# Patient Record
Sex: Male | Born: 1960
Health system: Southern US, Community
[De-identification: ages and names within clinical notes are randomized; demographics above are authoritative.]

## PROBLEM LIST (undated history)

## (undated) DIAGNOSIS — E785 Hyperlipidemia, unspecified: Secondary | ICD-10-CM

## (undated) DIAGNOSIS — M542 Cervicalgia: Secondary | ICD-10-CM

## (undated) DIAGNOSIS — I10 Essential (primary) hypertension: Secondary | ICD-10-CM

## (undated) DIAGNOSIS — E119 Type 2 diabetes mellitus without complications: Secondary | ICD-10-CM

## (undated) DIAGNOSIS — R200 Anesthesia of skin: Secondary | ICD-10-CM

## (undated) HISTORY — DX: Cervicalgia: M54.2

## (undated) HISTORY — DX: Type 2 diabetes mellitus without complications: E11.9

## (undated) HISTORY — PX: HIP SURGERY: SHX245

## (undated) HISTORY — DX: Hyperlipidemia, unspecified: E78.5

## (undated) HISTORY — DX: Anesthesia of skin: R20.0

---

## 2014-11-22 ENCOUNTER — Other Ambulatory Visit: Payer: Self-pay | Admitting: Family Medicine

## 2014-11-22 ENCOUNTER — Encounter: Payer: Self-pay | Admitting: Family Medicine

## 2014-11-22 DIAGNOSIS — E1165 Type 2 diabetes mellitus with hyperglycemia: Secondary | ICD-10-CM

## 2014-11-22 DIAGNOSIS — H9193 Unspecified hearing loss, bilateral: Secondary | ICD-10-CM | POA: Insufficient documentation

## 2014-11-22 DIAGNOSIS — E118 Type 2 diabetes mellitus with unspecified complications: Secondary | ICD-10-CM | POA: Insufficient documentation

## 2014-11-22 DIAGNOSIS — E785 Hyperlipidemia, unspecified: Secondary | ICD-10-CM

## 2014-11-22 DIAGNOSIS — H409 Unspecified glaucoma: Secondary | ICD-10-CM | POA: Insufficient documentation

## 2014-11-22 DIAGNOSIS — I10 Essential (primary) hypertension: Secondary | ICD-10-CM | POA: Insufficient documentation

## 2014-11-22 DIAGNOSIS — E1169 Type 2 diabetes mellitus with other specified complication: Secondary | ICD-10-CM | POA: Insufficient documentation

## 2014-11-22 MED ORDER — ACCU-CHEK SMARTVIEW VI STRP: 1.0000 | ORAL_STRIP | Freq: Two times a day (BID) | Status: DC | PRN

## 2015-06-17 ENCOUNTER — Ambulatory Visit: Payer: Self-pay | Admitting: Family Medicine

## 2015-06-17 ENCOUNTER — Ambulatory Visit (INDEPENDENT_AMBULATORY_CARE_PROVIDER_SITE_OTHER): Payer: Medicare Other | Admitting: Family Medicine

## 2015-06-17 VITALS — BP 118/76 | HR 80 | Ht 63.0 in | Wt 158.0 lb

## 2015-06-17 DIAGNOSIS — Z23 Encounter for immunization: Secondary | ICD-10-CM

## 2015-06-17 DIAGNOSIS — M7581 Other shoulder lesions, right shoulder: Secondary | ICD-10-CM

## 2015-06-17 DIAGNOSIS — I10 Essential (primary) hypertension: Secondary | ICD-10-CM

## 2015-06-17 DIAGNOSIS — H409 Unspecified glaucoma: Secondary | ICD-10-CM

## 2015-06-17 DIAGNOSIS — E119 Type 2 diabetes mellitus without complications: Secondary | ICD-10-CM | POA: Diagnosis not present

## 2015-06-17 DIAGNOSIS — Z8639 Personal history of other endocrine, nutritional and metabolic disease: Secondary | ICD-10-CM | POA: Diagnosis not present

## 2015-06-17 MED ORDER — ASPIRIN 81 MG PO TABS: 81.0000 mg | ORAL_TABLET | Freq: Every day | ORAL | Status: DC

## 2015-06-17 MED ORDER — AMLODIPINE BESY-BENAZEPRIL HCL 5-20 MG PO CAPS: 1.0000 | ORAL_CAPSULE | Freq: Every day | ORAL | Status: DC

## 2015-06-17 MED ORDER — SITAGLIPTIN PHOSPHATE 100 MG PO TABS: 100.0000 mg | ORAL_TABLET | Freq: Every day | ORAL | Status: DC

## 2015-06-17 MED ORDER — METFORMIN HCL 1000 MG PO TABS: 1000.0000 mg | ORAL_TABLET | Freq: Two times a day (BID) | ORAL | Status: DC

## 2015-06-17 MED ORDER — PIOGLITAZONE HCL 30 MG PO TABS: 30.0000 mg | ORAL_TABLET | Freq: Every day | ORAL | Status: DC

## 2015-06-17 MED ORDER — LATANOPROST 0.005 % OP SOLN: 1.0000 [drp] | Freq: Every day | OPHTHALMIC | Status: DC

## 2015-06-18 ENCOUNTER — Encounter: Payer: Self-pay | Admitting: Family Medicine

## 2015-06-18 ENCOUNTER — Other Ambulatory Visit: Payer: Self-pay | Admitting: Family Medicine

## 2015-06-18 LAB — LIPID PANEL
Chol/HDL Ratio: 5.3 ratio units — ABNORMAL HIGH (ref 0.0–5.0)
Cholesterol, Total: 155 mg/dL (ref 100–199)
HDL: 29 mg/dL — AB (ref >39)
Triglycerides: 516 mg/dL — ABNORMAL HIGH (ref 0–149)

## 2015-06-18 LAB — HEMOGLOBIN A1C
ESTIMATED AVERAGE GLUCOSE: 212 mg/dL
Hgb A1c MFr Bld: 9 % — ABNORMAL HIGH (ref 4.8–5.6)

## 2015-06-18 MED ORDER — SIMVASTATIN 40 MG PO TABS: 40.0000 mg | ORAL_TABLET | Freq: Every day | ORAL | Status: DC

## 2015-06-18 MED ORDER — GLIPIZIDE 5 MG PO TABS: 5.0000 mg | ORAL_TABLET | Freq: Two times a day (BID) | ORAL | Status: DC

## 2015-06-18 NOTE — Progress Notes (Signed)
Date:  06/17/2015   Name:  Daniel Bell   DOB:  Nov 19, 1960   MRN:  [id number]  PCP:  No primary care provider on file.    Chief Complaint: Diabetes and Hypertension   History of Present Illness:  This is a 55 y.o. male with T2DM, HTN, glaucoma, hx HLD for first f/u in 8 months. C/o R shoulder pain with abduction past 90 degrees past several months, not improving. Had some sharp nonexertional CP last week no ass cardiac sxs and no further episodes. Has optho appt in April. Has started asa 81 mg daily. Needs flu and pneumo imm.  Review of Systems:  Review of Systems  Constitutional: Negative for fever.  Respiratory: Negative for shortness of breath.   Cardiovascular: Negative for leg swelling.  Endocrine: Negative for polyuria.  Genitourinary: Negative for difficulty urinating.  Neurological: Negative for syncope and light-headedness.    Patient Active Problem List   Diagnosis Date Noted  . Diabetes mellitus type 2, controlled, without complications ([place]) 36/05/2448  . Essential (primary) hypertension 11/22/2014  . Glaucoma 11/22/2014  . Bilateral hearing loss 11/22/2014  . H/O elevated lipids 11/22/2014    Prior to Admission medications   Medication Sig Start Date End Date Taking? Authorizing Provider  ACCU-CHEK FASTCLIX LANCETS MISC 2 (two) times daily. for testing 08/31/14  Yes Historical Provider, MD  ACCU-CHEK SMARTVIEW test strip 1 each by Other route 2 (two) times daily as needed. 11/22/14  Yes Adline Potter, MD  amLODipine-benazepril (LOTREL) 5-20 MG capsule Take 1 capsule by mouth daily. 06/17/15  Yes Adline Potter, MD  Blood Glucose Monitoring Suppl (ACCU-CHEK NANO SMARTVIEW) W/DEVICE KIT  08/30/14  Yes Historical Provider, MD  Coenzyme Q10 (MEGA COQ10) 400 MG CAPS Take 1 capsule by mouth daily.   Yes Historical Provider, MD  latanoprost (XALATAN) 0.005 % ophthalmic solution Place 1 drop into both eyes at bedtime. 06/17/15  Yes Adline Potter, MD  metFORMIN (GLUCOPHAGE) 1000 MG  tablet Take 1 tablet (1,000 mg total) by mouth 2 (two) times daily. 06/17/15  Yes Adline Potter, MD  pioglitazone (ACTOS) 30 MG tablet Take 1 tablet (30 mg total) by mouth daily. 06/17/15  Yes Adline Potter, MD  sitaGLIPtin (JANUVIA) 100 MG tablet Take 1 tablet (100 mg total) by mouth daily. 06/17/15  Yes Adline Potter, MD  aspirin 81 MG tablet Take 1 tablet (81 mg total) by mouth daily. 06/17/15   Adline Potter, MD    No Known Allergies  No past surgical history on file.  Social History  Substance Use Topics  . Smoking status: Not on file  . Smokeless tobacco: Not on file  . Alcohol Use: Not on file    No family history on file.  Medication list has been reviewed and updated.  Physical Examination: BP 118/76 mmHg  Pulse 80  Ht 5' 3"  (1.6 m)  Wt 158 lb (71.668 kg)  BMI 28.00 kg/m2  Physical Exam  Constitutional: He appears well-developed and well-nourished.  Cardiovascular: Normal rate, regular rhythm and normal heart sounds.   Pulmonary/Chest: Effort normal and breath sounds normal.  Musculoskeletal: He exhibits no edema.  Decreased RUE abduction  Neurological: He is alert.  Skin: Skin is warm and dry.  Psychiatric: He has a normal mood and affect. His behavior is normal.  Nursing note and vitals reviewed.   Assessment and Plan:  1. Controlled type 2 diabetes mellitus without complication, without long-term current use of insulin (HCC) Last a1c 7.0% 10/02/14 - HgB A1c - Lipid Profile  2. Essential (primary) hypertension Well controlled, cont Lotrel  3. Right rotator cuff tendinitis - Ambulatory referral to Physical Therapy  4. Glaucoma Optho f/u in April  5. H/O elevated lipids Check lipids, consider statin given 10 yr CV risk 11.6%  6. Need for influenza vaccination - Flu Vaccine QUAD 36+ mos PF IM (Fluarix & Fluzone Quad PF)  7. Need for pneumococcal vaccination - Pneumococcal polysaccharide vaccine 23-valent greater than or equal to 2yo  subcutaneous/IM  Return in about 3 months (around 09/15/2015).  Satira Anis. [facility]  06/18/2015

## 2015-07-08 ENCOUNTER — Other Ambulatory Visit: Payer: Self-pay | Admitting: Family Medicine

## 2015-07-08 MED ORDER — SIMVASTATIN 40 MG PO TABS: 40.0000 mg | ORAL_TABLET | Freq: Every day | ORAL | Status: DC

## 2015-08-08 ENCOUNTER — Encounter: Payer: Self-pay | Admitting: Family Medicine

## 2015-08-08 ENCOUNTER — Ambulatory Visit (INDEPENDENT_AMBULATORY_CARE_PROVIDER_SITE_OTHER): Payer: Medicare Other | Admitting: Family Medicine

## 2015-08-08 VITALS — BP 118/68 | HR 88 | Temp 98.2°F | Resp 16 | Ht 63.0 in | Wt 157.0 lb

## 2015-08-08 DIAGNOSIS — E785 Hyperlipidemia, unspecified: Secondary | ICD-10-CM

## 2015-08-08 DIAGNOSIS — E1165 Type 2 diabetes mellitus with hyperglycemia: Secondary | ICD-10-CM | POA: Diagnosis not present

## 2015-08-08 DIAGNOSIS — Z23 Encounter for immunization: Secondary | ICD-10-CM | POA: Diagnosis not present

## 2015-08-08 DIAGNOSIS — H409 Unspecified glaucoma: Secondary | ICD-10-CM | POA: Diagnosis not present

## 2015-08-08 DIAGNOSIS — I10 Essential (primary) hypertension: Secondary | ICD-10-CM

## 2015-08-08 DIAGNOSIS — IMO0001 Reserved for inherently not codable concepts without codable children: Secondary | ICD-10-CM

## 2015-08-08 DIAGNOSIS — H9193 Unspecified hearing loss, bilateral: Secondary | ICD-10-CM

## 2015-08-08 NOTE — Progress Notes (Signed)
Date:  08/08/2015   Name:  Daniel Bell   DOB:  [birth year]   MRN:  [id number]  PCP:  No primary care provider on file.    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 Placed on glipizide and simvastatin last visit for marginally controlled DM and HLD. No new concerns, tolerating both meds well. BG's well controlled at home. 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   Review of Systems:  Review of Systems  Constitutional: Negative for fever and fatigue.  Respiratory: Negative for shortness of breath.   Cardiovascular: Negative for chest pain and leg swelling.  Endocrine: Negative for polyuria.  Genitourinary: Negative for difficulty urinating.  Neurological: Negative for syncope and light-headedness.    Patient Active Problem List   Diagnosis Date Noted  . Diabetes mellitus type 2, uncontrolled, without complications ([place]) 65/99/3570  . Essential (primary) hypertension 11/22/2014  . Glaucoma 11/22/2014  . Bilateral hearing loss 11/22/2014  . Hyperlipidemia 11/22/2014    Prior to Admission medications   Medication Sig Start Date End Date Taking? Authorizing Provider  amLODipine-benazepril (LOTREL) 5-20 MG capsule Take 1 capsule by mouth daily. 06/17/15  Yes Adline Potter, MD  aspirin 81 MG tablet Take 1 tablet (81 mg total) by mouth daily. 06/17/15  Yes Adline Potter, MD  Blood Glucose Monitoring Suppl (ACCU-CHEK NANO SMARTVIEW) W/DEVICE KIT  08/30/14  Yes Historical Provider, MD  Coenzyme Q10 (MEGA COQ10) 400 MG CAPS Take 1 capsule by mouth daily.   Yes Historical Provider, MD  glipiZIDE (GLUCOTROL) 5 MG tablet Take 1 tablet (5 mg total) by mouth 2 (two) times daily before a meal. 06/18/15  Yes Adline Potter, MD  latanoprost (XALATAN) 0.005 % ophthalmic solution Place 1 drop into both eyes at bedtime. 06/17/15  Yes Adline Potter, MD  metFORMIN (GLUCOPHAGE) 1000 MG tablet Take 1 tablet (1,000 mg total) by mouth  2 (two) times daily. 06/17/15  Yes Adline Potter, MD  pioglitazone (ACTOS) 30 MG tablet Take 1 tablet (30 mg total) by mouth daily. 06/17/15  Yes Adline Potter, MD  simvastatin (ZOCOR) 40 MG tablet Take 1 tablet (40 mg total) by mouth daily. 07/08/15  Yes Adline Potter, MD  sitaGLIPtin (JANUVIA) 100 MG tablet Take 1 tablet (100 mg total) by mouth daily. 06/17/15  Yes Adline Potter, MD  ACCU-CHEK FASTCLIX LANCETS MISC 2 (two) times daily. for testing 08/31/14   Historical Provider, MD  ACCU-CHEK SMARTVIEW test strip 1 each by Other route 2 (two) times daily as needed. 11/22/14   Adline Potter, MD    No Known Allergies  History reviewed. No pertinent past surgical history.  Social History  Substance Use Topics  . Smoking status: Never Smoker   . Smokeless tobacco: None  . Alcohol Use: No    Family History  Problem Relation Age of Onset  . Family history unknown: Yes    Medication list has been reviewed and updated.  Physical Examination: BP 118/68 mmHg  Pulse 88  Temp(Src) 98.2 F (36.8 C)  Resp 16  Ht 5' 3"  (1.6 m)  Wt 157 lb (71.215 kg)  BMI 27.82 kg/m2  SpO2 100%  Physical Exam  Constitutional: He is oriented to person, place, and time. He appears well-developed and well-nourished.  HENT:  Hearing aids in place  Eyes: Conjunctivae and EOM are normal. Pupils are equal, round, and reactive to light.  Neck: Neck supple. No thyromegaly present.  Cardiovascular:  Normal rate, regular rhythm and normal heart sounds.   Pulmonary/Chest: Effort normal and breath sounds normal.  Abdominal: Soft. He exhibits no distension and no mass. There is no tenderness.  Musculoskeletal: He exhibits no edema.  Lymphadenopathy:    He has no cervical adenopathy.  Neurological: He is alert and oriented to person, place, and time. Coordination normal.  Skin: Skin is warm and dry.  Psychiatric: He has a normal mood and affect. His behavior is normal.  Nursing note and vitals reviewed.   Assessment  and Plan:  1. Essential (primary) hypertension Well controlled, cont Lotrel  2. Bilateral hearing loss Hearing aids in place, hearing grossly intact  3. Uncontrolled type 2 diabetes mellitus without complication, without long-term current use of insulin ([place]) A1c 9.0% in Jan., requests defer repeat testing until scheduled visit next month  4. Hyperlipidemia On Zocor, recheck lipids next visit  5. Gl[facility]heck [facility] when returns for [facility]  6. 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  Satira Anis. [facility]  08/08/2015

## 2015-08-12 ENCOUNTER — Ambulatory Visit (INDEPENDENT_AMBULATORY_CARE_PROVIDER_SITE_OTHER): Payer: Medicare Other

## 2015-08-12 DIAGNOSIS — Z111 Encounter for screening for respiratory tuberculosis: Secondary | ICD-10-CM

## 2015-09-16 ENCOUNTER — Ambulatory Visit: Payer: Medicare Other | Admitting: Family Medicine

## 2015-10-16 ENCOUNTER — Encounter: Payer: Self-pay | Admitting: Family Medicine

## 2015-10-16 ENCOUNTER — Ambulatory Visit (INDEPENDENT_AMBULATORY_CARE_PROVIDER_SITE_OTHER): Payer: Medicare Other | Admitting: Family Medicine

## 2015-10-16 VITALS — BP 122/82 | HR 95 | Resp 16 | Ht 63.0 in | Wt 149.6 lb

## 2015-10-16 DIAGNOSIS — I10 Essential (primary) hypertension: Secondary | ICD-10-CM | POA: Diagnosis not present

## 2015-10-16 DIAGNOSIS — E785 Hyperlipidemia, unspecified: Secondary | ICD-10-CM

## 2015-10-16 DIAGNOSIS — H409 Unspecified glaucoma: Secondary | ICD-10-CM | POA: Diagnosis not present

## 2015-10-16 DIAGNOSIS — E1165 Type 2 diabetes mellitus with hyperglycemia: Secondary | ICD-10-CM

## 2015-10-16 DIAGNOSIS — IMO0001 Reserved for inherently not codable concepts without codable children: Secondary | ICD-10-CM

## 2015-10-16 MED ORDER — METFORMIN HCL 1000 MG PO TABS: 1000.0000 mg | ORAL_TABLET | Freq: Two times a day (BID) | ORAL | Status: DC

## 2015-10-16 MED ORDER — PIOGLITAZONE HCL 30 MG PO TABS: 30.0000 mg | ORAL_TABLET | Freq: Every day | ORAL | Status: DC

## 2015-10-16 MED ORDER — AMLODIPINE BESY-BENAZEPRIL HCL 5-20 MG PO CAPS: 1.0000 | ORAL_CAPSULE | Freq: Every day | ORAL | Status: DC

## 2015-10-16 MED ORDER — GLIPIZIDE 5 MG PO TABS: 5.0000 mg | ORAL_TABLET | Freq: Two times a day (BID) | ORAL | Status: DC

## 2015-10-16 MED ORDER — SITAGLIPTIN PHOSPHATE 100 MG PO TABS: 100.0000 mg | ORAL_TABLET | Freq: Every day | ORAL | Status: DC

## 2015-10-16 MED ORDER — SIMVASTATIN 40 MG PO TABS: 40.0000 mg | ORAL_TABLET | Freq: Every day | ORAL | Status: DC

## 2015-10-16 NOTE — Progress Notes (Signed)
Date:  10/16/2015   Name:  Daniel Bell   DOB:  01-03-1961   MRN:  [id number]  PCP:  No primary care provider on file.    Chief Complaint: Hypertension   History of Present Illness:  This is a 55 y.o. male seen for two month f/u. No new concerns. Needs refills all meds. Glipizide and Zocor started in January. Seeing optho q 73mfor glaucoma.   Review of Systems:  Review of Systems  Constitutional: Negative for fever and fatigue.  Respiratory: Negative for cough and shortness of breath.   Cardiovascular: Negative for chest pain and leg swelling.  Endocrine: Negative for polyuria.  Genitourinary: Negative for difficulty urinating.  Neurological: Negative for syncope and light-headedness.    Patient Active Problem List   Diagnosis Date Noted  . Diabetes mellitus type 2, uncontrolled, [place] complications [place] 026/33/3545 . Essential (primary) hypertension 11/22/2014  . Glaucoma 11/22/2014  . Bilateral hearing loss 11/22/2014  . Hyperlipidemia 11/22/2014    Prior to Admission medications   Medication Sig Start Date End Date Taking? Authorizing Provider  ACCU-CHEK FASTCLIX LANCETS MISC 2 (two) times daily. for testing 08/31/14  Yes Historical Provider, MD  ACCU-CHEK SMARTVIEW test strip 1 each by Other route 2 (two) times daily as needed. 11/22/14  Yes WAdline Potter MD  amLODipine-benazepril (LOTREL) 5-20 MG capsule Take 1 capsule by mouth daily. 10/16/15  Yes WAdline Potter MD  aspirin 81 MG tablet Take 1 tablet (81 mg total) by mouth daily. 06/17/15  Yes WAdline Potter MD  Blood Glucose Monitoring Suppl (ACCU-CHEK NANO SMARTVIEW) W/DEVICE KIT  08/30/14  Yes Historical Provider, MD  Coenzyme Q10 (MEGA COQ10) 400 MG CAPS Take 1 capsule by mouth daily.   Yes Historical Provider, MD  glipiZIDE (GLUCOTROL) 5 MG tablet Take 1 tablet (5 mg total) by mouth 2 (two) times daily before a meal. 10/16/15  Yes WAdline Potter MD  latanoprost (XALATAN) 0.005 % ophthalmic solution Place 1 drop into both  eyes at bedtime. 06/17/15  Yes WAdline Potter MD  metFORMIN (GLUCOPHAGE) 1000 MG tablet Take 1 tablet (1,000 mg total) by mouth 2 (two) times daily. 10/16/15  Yes WAdline Potter MD  pioglitazone (ACTOS) 30 MG tablet Take 1 tablet (30 mg total) by mouth daily. 10/16/15  Yes WAdline Potter MD  simvastatin (ZOCOR) 40 MG tablet Take 1 tablet (40 mg total) by mouth daily. 10/16/15  Yes WAdline Potter MD  sitaGLIPtin (JANUVIA) 100 MG tablet Take 1 tablet (100 mg total) by mouth daily. 10/16/15  Yes WAdline Potter MD    No Known Allergies  History reviewed. No pertinent past surgical history.  Social History  Substance Use Topics  . Smoking status: Never Smoker   . Smokeless tobacco: None  . Alcohol Use: No    Family History  Problem Relation Age of Onset  . Family history unknown: Yes    Medication list has been reviewed and updated.  Physical Examination: BP 122/82 mmHg  Pulse 95  Resp 16  Ht _0  (1.6 m)  Wt 149 lb 9.6 oz (67.858 kg)  BMI 26.51 kg/m2  SpO2 99%  Physical Exam  Constitutional: He appears well-developed and well-nourished.  Cardiovascular: Normal rate, regular rhythm and normal heart sounds.   Pulmonary/Chest: Effort normal and breath sounds normal.  Musculoskeletal: He exhibits no edema.  Neurological: He is alert.  Skin: Skin is warm and dry.  Psychiatric: He has a normal mood and affect. His behavior is normal.  Nursing note and vitals reviewed.  Assessment and Plan:  1. 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 ratio  2. Essential (primary) hypertension Well controlled on Lotrel - Hepatitis C Antibody - Comprehensive Metabolic Panel (CMET)  3. Glaucoma Followed by optho  4. Hyperlipidemia Unclear control on Zocor x 4 months - Lipid  Profile  Return in about 3 months (around 01/16/2016).  Satira Anis. [facility]  10/16/2015

## 2015-10-17 LAB — LIPID PANEL
CHOL/HDL RATIO: 2.7 ratio (ref 0.0–5.0)
Cholesterol, Total: 101 mg/dL (ref 100–199)
HDL: 37 mg/dL — AB (ref >39)
LDL CALC: 41 mg/dL (ref 0–99)
Triglycerides: 115 mg/dL (ref 0–149)
VLDL CHOLESTEROL CAL: 23 mg/dL (ref 5–40)

## 2015-10-17 LAB — COMPREHENSIVE METABOLIC PANEL
ALBUMIN: 4.8 g/dL (ref 3.5–5.5)
ALT: 14 IU/L (ref 0–44)
AST: 13 IU/L (ref 0–40)
Albumin/Globulin Ratio: 1.8 (ref 1.2–2.2)
Alkaline Phosphatase: 125 IU/L — ABNORMAL HIGH (ref 39–117)
BUN / CREAT RATIO: 21 — AB (ref 9–20)
BUN: 20 mg/dL (ref 6–24)
Bilirubin Total: 0.6 mg/dL (ref 0.0–1.2)
CALCIUM: 9.8 mg/dL (ref 8.7–10.2)
CO2: 19 mmol/L (ref 18–29)
CREATININE: 0.96 mg/dL (ref 0.76–1.27)
Chloride: 98 mmol/L (ref 96–106)
GFR calc non Af Amer: 89 mL/min/{1.73_m2} (ref >59)
GFR, EST AFRICAN AMERICAN: 102 mL/min/{1.73_m2} (ref >59)
Globulin, Total: 2.7 g/dL (ref 1.5–4.5)
Glucose: 163 mg/dL — ABNORMAL HIGH (ref 65–99)
Potassium: 4.5 mmol/L (ref 3.5–5.2)
Sodium: 136 mmol/L (ref 134–144)
TOTAL PROTEIN: 7.5 g/dL (ref 6.0–8.5)

## 2015-10-17 LAB — HEMOGLOBIN A1C
ESTIMATED AVERAGE GLUCOSE: 203 mg/dL
HEMOGLOBIN A1C: 8.7 % — AB (ref 4.8–5.6)

## 2015-10-17 LAB — MICROALBUMIN / CREATININE URINE RATIO
Creatinine, Urine: 113.1 mg/dL
MICROALB/CREAT RATIO: 8.3 mg/g creat (ref 0.0–30.0)
Microalbumin, Urine: 9.4 ug/mL

## 2015-10-17 LAB — HEPATITIS C ANTIBODY: HEP C VIRUS AB: 0.1 {s_co_ratio} (ref 0.0–0.9)

## 2015-11-20 ENCOUNTER — Encounter: Payer: Self-pay | Admitting: Emergency Medicine

## 2015-11-20 ENCOUNTER — Ambulatory Visit
Admission: EM | Admit: 2015-11-20 | Discharge: 2015-11-20 | Disposition: A | Discharge status: Home / Self Care | Payer: Medicare Other | Attending: Family Medicine | Admitting: Family Medicine

## 2015-11-20 ENCOUNTER — Ambulatory Visit (INDEPENDENT_AMBULATORY_CARE_PROVIDER_SITE_OTHER): Payer: Medicare Other

## 2015-11-20 DIAGNOSIS — M5412 Radiculopathy, cervical region: Secondary | ICD-10-CM

## 2015-11-20 DIAGNOSIS — M50321 Other cervical disc degeneration at C4-C5 level: Secondary | ICD-10-CM | POA: Diagnosis not present

## 2015-11-20 HISTORY — DX: Essential (primary) hypertension: I10

## 2015-11-20 MED ORDER — CYCLOBENZAPRINE HCL 5 MG PO TABS: ORAL_TABLET | ORAL | Status: DC

## 2015-11-20 MED ORDER — NAPROXEN 500 MG PO TABS: 500.0000 mg | ORAL_TABLET | Freq: Two times a day (BID) | ORAL | Status: DC

## 2015-11-20 NOTE — ED Provider Notes (Signed)
CSN: [id number]     Arrival date & time 11/20/15  1223 History   First MD Initiated Contact with Patient 11/20/15 1246     Chief Complaint  Patient presents with  . Extremity Pain   (Consider location/radiation/quality/duration/timing/severity/associated sxs/prior Treatment) HPI: Patient presents today with symptoms of right arm pain and at times numbness. Patient states that the symptoms started around his right scapular area and come down his arm into his forearm and to his right pinky. Patient states that he has never had symptoms like this before. He denies any trauma or injury to his shoulder or neck or elbow recently. He did have an injury to his right elbow/forearm area when he was a child but did not see a doctor for it. He has limited extension and supination because of this injury as a child. He has been trying to sleep on a few pillows since his symptoms started and has slept in a recliner. He denies any right leg symptoms or headache. He denies any chest pain or shortness of breath. He has had the symptoms for about a week now.  Past Medical History  Diagnosis Date  . Diabetes mellitus without complication ([place])   . Hyperlipidemia   . Hypertension    History reviewed. No pertinent past surgical history. Family History  Problem Relation Age of Onset  . Diabetes Mother    Social History  Substance Use Topics  . Smoking status: Current Some Day Smoker  . Smokeless tobacco: None  . Alcohol Use: No    Review of Systems: Negative except mentioned above.   Allergies  Review of patient's allergies indicates no known allergies.  Home Medications   Prior to Admission medications   Medication Sig Start Date End Date Taking? Authorizing Provider  ACCU-CHEK FASTCLIX LANCETS MISC 2 (two) times daily. for testing 08/31/14   Historical Provider, MD  ACCU-CHEK SMARTVIEW test strip 1 each by Other route 2 (two) times daily as needed. 11/22/14   Adline Potter, MD  amLODipine-benazepril  (LOTREL) 5-20 MG capsule Take 1 capsule by mouth daily. 10/16/15   Adline Potter, MD  aspirin 81 MG tablet Take 1 tablet (81 mg total) by mouth daily. 06/17/15   Adline Potter, MD  Blood Glucose Monitoring Suppl (ACCU-CHEK NANO SMARTVIEW) W/DEVICE KIT  08/30/14   Historical Provider, MD  Coenzyme Q10 (MEGA COQ10) 400 MG CAPS Take 1 capsule by mouth daily.    Historical Provider, MD  glipiZIDE (GLUCOTROL) 5 MG tablet Take 1 tablet (5 mg total) by mouth 2 (two) times daily before a meal. 10/16/15   Adline Potter, MD  latanoprost (XALATAN) 0.005 % ophthalmic solution Place 1 drop into both eyes at bedtime. 06/17/15   Adline Potter, MD  metFORMIN (GLUCOPHAGE) 1000 MG tablet Take 1 tablet (1,000 mg total) by mouth 2 (two) times daily. 10/16/15   Adline Potter, MD  pioglitazone (ACTOS) 30 MG tablet Take 1 tablet (30 mg total) by mouth daily. 10/16/15   Adline Potter, MD  simvastatin (ZOCOR) 40 MG tablet Take 1 tablet (40 mg total) by mouth daily. 10/16/15   Adline Potter, MD  sitaGLIPtin (JANUVIA) 100 MG tablet Take 1 tablet (100 mg total) by mouth daily. 10/16/15   Adline Potter, MD   Meds Ordered and Administered this Visit  Medications - No data to display  BP 122/64 mmHg  Pulse 89  Temp(Src) 97 F (36.1 C) (Tympanic)  Resp 18  Wt 152 lb (68.947 kg)  SpO2 99% No data found.   Physical  Exam:   GENERAL: NAD RESP: CTA B CARD: RRR MSK: mild kyphosis, no midline tenderness, mild upper trap. and right posterior scapular tenderness, states symptoms go into right lateral shoulder into arm and forearm into 5th digit, ROM limited with full extension at elbow and limited supination (due to past injury), mild reproduction of symptoms with neck extension, normal neck flexion, 5/5 strength of UEs, nv intact  NEURO: CN II-XII grossly intact   ED Course  Procedures (including critical care time)  Labs Review Labs Reviewed - No data to display  Imaging Review No results found.       MDM  A/P: R  Cervical Radiculopathy- X-rays show degenerative changes in the cervical spine, discussed the results with the patient, would recommend that patient take anti-inflammatory and muscle relaxant, patient is not to use a muscle relaxant if having to drive or take care of machinery, etc. I have recommended that the patient to follow-up with his primary care physician in the next week. Patient may need further imaging for his symptoms with MRI. If any worsening symptoms he will seek immediate medical attention. I encourage the patient to use a thin pillow and sleeping in the bed. Ice/heat when necessary, encouraged good posture     Paulina Fusi, MD 11/20/15 1329

## 2015-11-20 NOTE — ED Notes (Signed)
Patient states he has had pain and numbness in his right arm and shoulder which radiates down his right arm

## 2015-11-22 ENCOUNTER — Ambulatory Visit (INDEPENDENT_AMBULATORY_CARE_PROVIDER_SITE_OTHER): Payer: Medicare Other | Admitting: Family Medicine

## 2015-11-22 ENCOUNTER — Encounter: Payer: Self-pay | Admitting: Family Medicine

## 2015-11-22 VITALS — BP 118/82 | HR 90 | Resp 16 | Ht 63.0 in | Wt 152.0 lb

## 2015-11-22 DIAGNOSIS — E1165 Type 2 diabetes mellitus with hyperglycemia: Secondary | ICD-10-CM | POA: Diagnosis not present

## 2015-11-22 DIAGNOSIS — IMO0001 Reserved for inherently not codable concepts without codable children: Secondary | ICD-10-CM

## 2015-11-22 DIAGNOSIS — M5412 Radiculopathy, cervical region: Secondary | ICD-10-CM | POA: Insufficient documentation

## 2015-11-22 MED ORDER — CYCLOBENZAPRINE HCL 10 MG PO TABS: ORAL_TABLET | ORAL | Status: DC

## 2015-11-22 NOTE — Progress Notes (Signed)
Date:  11/22/2015   Name:  Daniel Bell   DOB:  Jul 21, 1960   MRN:  [id number]  PCP:  No primary care provider on file.    Chief Complaint: Back Pain   History of Present Illness:  This is a 55 y.o. male seen at [facility] 2d ago for R shoulder and arm pain, XR showed DDD and some foraminal narrowing, rx'd with Naprosyn and Flexeril 5 mg bid prn. Feels muscle relaxant not strong enough, still having trouble sleeping.  Review of Systems:  Review of Systems  Constitutional: Negative for fever.  Respiratory: Negative for cough and shortness of breath.   Cardiovascular: Negative for chest pain and leg swelling.  Endocrine: Negative for polyuria.  Genitourinary: Negative for difficulty urinating.  Neurological: Negative for syncope and light-headedness.    Patient Active Problem List   Diagnosis Date Noted  . Cervical radiculopathy 11/22/2015  . Diabetes mellitus type 2, uncontrolled, without complications ([place]) 21/19/4174  . Essential (primary) hypertension 11/22/2014  . Glaucoma 11/22/2014  . Bilateral hearing loss 11/22/2014  . Hyperlipidemia 11/22/2014    Prior to Admission medications   Medication Sig Start Date End Date Taking? Authorizing Provider  ACCU-CHEK FASTCLIX LANCETS MISC 2 (two) times daily. for testing 08/31/14  Yes Historical Provider, MD  ACCU-CHEK SMARTVIEW test strip 1 each by Other route 2 (two) times daily as needed. 11/22/14  Yes Adline Potter, MD  amLODipine-benazepril (LOTREL) 5-20 MG capsule Take 1 capsule by mouth daily. 10/16/15  Yes Adline Potter, MD  aspirin 81 MG tablet Take 1 tablet (81 mg total) by mouth daily. 06/17/15  Yes Adline Potter, MD  Blood Glucose Monitoring Suppl (ACCU-CHEK NANO SMARTVIEW) W/DEVICE KIT  08/30/14  Yes Historical Provider, MD  Coenzyme Q10 (MEGA COQ10) 400 MG CAPS Take 1 capsule by mouth daily.   Yes Historical Provider, MD  cyclobenzaprine (FLEXERIL) 10 MG tablet Take one tab PO every eight hours prn muscle spasm. Can cause drowsiness.  11/22/15  Yes Adline Potter, MD  diphenhydramine-acetaminophen (TYLENOL PM) 25-500 MG TABS tablet Take 1 tablet by mouth at bedtime as needed.   Yes Historical Provider, MD  glipiZIDE (GLUCOTROL) 5 MG tablet Take 1 tablet (5 mg total) by mouth 2 (two) times daily before a meal. 10/16/15  Yes Adline Potter, MD  latanoprost (XALATAN) 0.005 % ophthalmic solution Place 1 drop into both eyes at bedtime. 06/17/15  Yes Adline Potter, MD  metFORMIN (GLUCOPHAGE) 1000 MG tablet Take 1 tablet (1,000 mg total) by mouth 2 (two) times daily. 10/16/15  Yes Adline Potter, MD  naproxen (NAPROSYN) 500 MG tablet Take 1 tablet (500 mg total) by mouth 2 (two) times daily. 11/20/15  Yes Paulina Fusi, MD  pioglitazone (ACTOS) 30 MG tablet Take 1 tablet (30 mg total) by mouth daily. 10/16/15  Yes Adline Potter, MD  simvastatin (ZOCOR) 40 MG tablet Take 1 tablet (40 mg total) by mouth daily. 10/16/15  Yes Adline Potter, MD  sitaGLIPtin (JANUVIA) 100 MG tablet Take 1 tablet (100 mg total) by mouth daily. 10/16/15  Yes Adline Potter, MD    No Known Allergies  History reviewed. No pertinent past surgical history.  Social History  Substance Use Topics  . Smoking status: Current Some Day Smoker  . Smokeless tobacco: None  . Alcohol Use: No    Family History  Problem Relation Age of Onset  . Diabetes Mother     Medication list has been reviewed and updated.  Physical Examination: BP 118/82 mmHg  Pulse 90  Resp 16  Ht 5' 3"  (1.6 m)  Wt 152 lb (68.947 kg)  BMI 26.93 kg/m2  SpO2 99%  Physical Exam  Constitutional: He appears well-developed and well-nourished.  Neck: Neck supple.  Neurological: He is alert.  RUE strength intact  Skin: Skin is warm and dry.  Psychiatric: He has a normal mood and affect. His behavior is normal.  Nursing note and vitals reviewed.   Assessment and Plan:  1. Cervical radiculopathy Cont Naprosyn, increase Flexeril to 10 mg q8h prn  2. Uncontrolled type 2 diabetes mellitus  without complication, without long-term current use of insulin (HCC) Avoiding steroids, has scheduled f/u in 2 months  Return if symptoms worsen or fail to improve.  Satira Anis. [facility]  11/22/2015

## 2015-11-27 ENCOUNTER — Ambulatory Visit
Admission: EM | Admit: 2015-11-27 | Discharge: 2015-11-27 | Disposition: A | Discharge status: Home / Self Care | Payer: Medicare Other | Attending: Family Medicine | Admitting: Family Medicine

## 2015-11-27 ENCOUNTER — Encounter: Payer: Self-pay | Admitting: *Deleted

## 2015-11-27 DIAGNOSIS — M542 Cervicalgia: Secondary | ICD-10-CM

## 2015-11-27 DIAGNOSIS — M5412 Radiculopathy, cervical region: Secondary | ICD-10-CM | POA: Diagnosis not present

## 2015-11-27 MED ORDER — KETOROLAC TROMETHAMINE 60 MG/2ML IM SOLN
60.0000 mg | Freq: Once | INTRAMUSCULAR | Status: AC
  Administered 2015-11-27: 60 mg via INTRAMUSCULAR

## 2015-11-27 MED ORDER — HYDROCODONE-ACETAMINOPHEN 5-325 MG PO TABS: ORAL_TABLET | ORAL | Status: DC

## 2015-11-27 NOTE — ED Provider Notes (Signed)
CSN: [id number]     Arrival date & time 11/27/15  1126 History   First MD Initiated Contact with Patient 11/27/15 1151     Chief Complaint  Patient presents with  . Neck Pain  . Arm Pain   (Consider location/radiation/quality/duration/timing/severity/associated sxs/prior Treatment) HPI Comments: 55 yo male seen here and by PCP recently with neck pain, arm pain states medications not helping. Still having pain which radiates down the right shoulder/arm.   The history is provided by the patient.    Past Medical History  Diagnosis Date  . Diabetes mellitus without complication ([place])   . Hyperlipidemia   . Hypertension    History reviewed. No pertinent past surgical history. Family History  Problem Relation Age of Onset  . Diabetes Mother    Social History  Substance Use Topics  . Smoking status: Current Some Day Smoker  . Smokeless tobacco: None  . Alcohol Use: No    Review of Systems  Allergies  Review of patient's allergies indicates no known allergies.  Home Medications   Prior to Admission medications   Medication Sig Start Date End Date Taking? Authorizing Provider  amLODipine-benazepril (LOTREL) 5-20 MG capsule Take 1 capsule by mouth daily. 10/16/15  Yes Adline Potter, MD  aspirin 81 MG tablet Take 1 tablet (81 mg total) by mouth daily. 06/17/15  Yes Adline Potter, MD  diphenhydramine-acetaminophen (TYLENOL PM) 25-500 MG TABS tablet Take 1 tablet by mouth at bedtime as needed.   Yes Historical Provider, MD  latanoprost (XALATAN) 0.005 % ophthalmic solution Place 1 drop into both eyes at bedtime. 06/17/15  Yes Adline Potter, MD  metFORMIN (GLUCOPHAGE) 1000 MG tablet Take 1 tablet (1,000 mg total) by mouth 2 (two) times daily. 10/16/15  Yes Adline Potter, MD  naproxen (NAPROSYN) 500 MG tablet Take 1 tablet (500 mg total) by mouth 2 (two) times daily. 11/20/15  Yes Paulina Fusi, MD  pioglitazone (ACTOS) 30 MG tablet Take 1 tablet (30 mg total) by mouth daily. 10/16/15  Yes  Adline Potter, MD  simvastatin (ZOCOR) 40 MG tablet Take 1 tablet (40 mg total) by mouth daily. 10/16/15  Yes Adline Potter, MD  sitaGLIPtin (JANUVIA) 100 MG tablet Take 1 tablet (100 mg total) by mouth daily. 10/16/15  Yes Adline Potter, MD  ACCU-CHEK FASTCLIX LANCETS MISC 2 (two) times daily. for testing 08/31/14   Historical Provider, MD  ACCU-CHEK SMARTVIEW test strip 1 each by Other route 2 (two) times daily as needed. 11/22/14   Adline Potter, MD  Blood Glucose Monitoring Suppl (ACCU-CHEK NANO SMARTVIEW) W/DEVICE KIT  08/30/14   Historical Provider, MD  Coenzyme Q10 (MEGA COQ10) 400 MG CAPS Take 1 capsule by mouth daily.    Historical Provider, MD  cyclobenzaprine (FLEXERIL) 10 MG tablet Take one tab PO every eight hours prn muscle spasm. Can cause drowsiness. 11/22/15   Adline Potter, MD  glipiZIDE (GLUCOTROL) 5 MG tablet Take 1 tablet (5 mg total) by mouth 2 (two) times daily before a meal. 10/16/15   Adline Potter, MD  HYDROcodone-acetaminophen (NORCO/VICODIN) 5-325 MG tablet 1-2 tabs q 8 hours prn 11/27/15   Norval Gable, MD   Meds Ordered and Administered this Visit   Medications  ketorolac (TORADOL) injection 60 mg (60 mg Intramuscular Given 11/27/15 1220)    BP 123/72 mmHg  Pulse 93  Temp(Src) 97.6 F (36.4 C) (Oral)  Resp 16  Ht 5' 3"  (1.6 m)  Wt 152 lb (68.947 kg)  BMI 26.93 kg/m2  SpO2 100% No data found.  Physical Exam  Constitutional: He appears well-developed and well-nourished. No distress.  Neck: Normal range of motion. Neck supple. No tracheal deviation present.  Pulmonary/Chest: Effort normal. No stridor. No respiratory distress.  Musculoskeletal:       Cervical back: He exhibits tenderness (over the cervical paraspinous muscles and trapezius muscles) and spasm. He exhibits normal range of motion, no bony tenderness, no swelling, no edema, no deformity, no laceration, no pain and normal pulse.       Lumbar back: He exhibits normal range of motion, no bony tenderness,  no swelling, no edema, no deformity, no laceration, no pain and normal pulse.  Neurological: He is alert. He has normal reflexes. He exhibits normal muscle tone. Coordination normal.  Skin: No rash noted. He is not diaphoretic.  Nursing note and vitals reviewed.   ED Course  Procedures (including critical care time)  Labs Review Labs Reviewed - No data to display  Imaging Review No results found.   Visual Acuity Review  Right Eye Distance:   Left Eye Distance:   Bilateral Distance:    Right Eye Near:   Left Eye Near:    Bilateral Near:         MDM   1. Cervicalgia   2. Cervical radiculopathy    Discharge Medication List as of 11/27/2015 12:36 PM    START taking these medications   Details  HYDROcodone-acetaminophen (NORCO/VICODIN) 5-325 MG tablet 1-2 tabs q 8 hours prn, Print       1.  diagnosis reviewed with patient 2. rx as per orders above; reviewed possible side effects, interactions, risks and benefits  3. Patient given toradol 44m IM x1  4.Recommend supportive treatment with heat to area; gentle range of motion  5. Follow-up with PCP    ONorval Gable MD 11/27/15 2111

## 2015-11-27 NOTE — ED Notes (Signed)
Pt seen here 1 week ago for right arm pain. Here today for same pain but now states pain begins in his neck and radiates to right shoulder and arm. Also, states areas of numbness in right hand.

## 2015-11-27 NOTE — Discharge Instructions (Signed)

## 2015-11-28 ENCOUNTER — Ambulatory Visit (INDEPENDENT_AMBULATORY_CARE_PROVIDER_SITE_OTHER): Payer: Medicare Other | Admitting: Family Medicine

## 2015-11-28 ENCOUNTER — Encounter: Payer: Self-pay | Admitting: Family Medicine

## 2015-11-28 VITALS — BP 110/78 | HR 103 | Resp 16 | Ht 62.0 in | Wt 149.0 lb

## 2015-11-28 DIAGNOSIS — M5412 Radiculopathy, cervical region: Secondary | ICD-10-CM

## 2015-11-28 NOTE — Progress Notes (Signed)
Date:  11/28/2015   Name:  Daniel Bell   DOB:  May 06, 1961   MRN:  [id number]  PCP:  No primary care provider on file.    Chief Complaint: Neck Pain   History of Present Illness:  This is a 55 y.o. male with persistent neck pain radiating down R arm with numbness of R 5th finger. Seen [place] 6/21, here 6/23, MUC 6/28, Naprosyn and Flexeril ineffective, Vicodin helping some. T2DM marginally controlled on multiple oral meds.  Review of Systems:  Review of Systems  Constitutional: Negative for fever.  HENT: Negative for trouble swallowing.   Respiratory: Negative for cough and shortness of breath.   Cardiovascular: Negative for chest pain and leg swelling.  Neurological: Negative for syncope, light-headedness and headaches.    Patient Active Problem List   Diagnosis Date Noted  . Cervical radiculopathy 11/22/2015  . Diabetes mellitus type 2, uncontrolled, without complications ([place]) 65/99/3570  . Essential (primary) hypertension 11/22/2014  . Glaucoma 11/22/2014  . Bilateral hearing loss 11/22/2014  . Hyperlipidemia 11/22/2014    Prior to Admission medications   Medication Sig Start Date End Date Taking? Authorizing Provider  ACCU-CHEK FASTCLIX LANCETS MISC 2 (two) times daily. for testing 08/31/14  Yes Historical Provider, MD  ACCU-CHEK SMARTVIEW test strip 1 each by Other route 2 (two) times daily as needed. 11/22/14  Yes Adline Potter, MD  amLODipine-benazepril (LOTREL) 5-20 MG capsule Take 1 capsule by mouth daily. 10/16/15  Yes Adline Potter, MD  aspirin 81 MG tablet Take 1 tablet (81 mg total) by mouth daily. 06/17/15  Yes Adline Potter, MD  Blood Glucose Monitoring Suppl (ACCU-CHEK NANO SMARTVIEW) W/DEVICE KIT  08/30/14  Yes Historical Provider, MD  Coenzyme Q10 (MEGA COQ10) 400 MG CAPS Take 1 capsule by mouth daily.   Yes Historical Provider, MD  cyclobenzaprine (FLEXERIL) 10 MG tablet Take one tab PO every eight hours prn muscle spasm. Can cause drowsiness. 11/22/15  Yes Adline Potter,  MD  diphenhydramine-acetaminophen (TYLENOL PM) 25-500 MG TABS tablet Take 1 tablet by mouth at bedtime as needed.   Yes Historical Provider, MD  glipiZIDE (GLUCOTROL) 5 MG tablet Take 1 tablet (5 mg total) by mouth 2 (two) times daily before a meal. 10/16/15  Yes Adline Potter, MD  HYDROcodone-acetaminophen (NORCO/VICODIN) 5-325 MG tablet 1-2 tabs q 8 hours prn 11/27/15  Yes Norval Gable, MD  latanoprost (XALATAN) 0.005 % ophthalmic solution Place 1 drop into both eyes at bedtime. 06/17/15  Yes Adline Potter, MD  metFORMIN (GLUCOPHAGE) 1000 MG tablet Take 1 tablet (1,000 mg total) by mouth 2 (two) times daily. 10/16/15  Yes Adline Potter, MD  naproxen (NAPROSYN) 500 MG tablet Take 1 tablet (500 mg total) by mouth 2 (two) times daily. 11/20/15  Yes Paulina Fusi, MD  pioglitazone (ACTOS) 30 MG tablet Take 1 tablet (30 mg total) by mouth daily. 10/16/15  Yes Adline Potter, MD  simvastatin (ZOCOR) 40 MG tablet Take 1 tablet (40 mg total) by mouth daily. 10/16/15  Yes Adline Potter, MD  sitaGLIPtin (JANUVIA) 100 MG tablet Take 1 tablet (100 mg total) by mouth daily. 10/16/15  Yes Adline Potter, MD    No Known Allergies  History reviewed. No pertinent past surgical history.  Social History  Substance Use Topics  . Smoking status: Current Some Day Smoker  . Smokeless tobacco: None  . Alcohol Use: No    Family History  Problem Relation Age of Onset  . Diabetes Mother     Medication list has been reviewed and  updated.  Physical Examination: BP 110/78 mmHg  Pulse 103  Resp 16  Ht _0  (1.575 m)  Wt 149 lb (67.586 kg)  BMI 27.25 kg/m2  SpO2 100%  Physical Exam  Constitutional: He appears well-developed and well-nourished.  Musculoskeletal:  Mod tender over lower midline cervical spine  Neurological:  Decreased sensation/strength over R C7 distribution  Nursing note and vitals reviewed.   Assessment and Plan:  1. Cervical radiculopathy, right Persistent past two weeks, unresponsive  to meds - MR Cervical Spine Wo Contrast; Future  Return in about 2 months (around 01/28/2016), or if symptoms worsen or fail to improve.  Satira Anis. [facility]  11/28/2015

## 2015-11-29 DIAGNOSIS — M4692 Unspecified inflammatory spondylopathy, cervical region: Secondary | ICD-10-CM | POA: Diagnosis not present

## 2015-11-29 DIAGNOSIS — M5010 Cervical disc disorder with radiculopathy, unspecified cervical region: Secondary | ICD-10-CM | POA: Diagnosis not present

## 2015-11-29 DIAGNOSIS — M5030 Other cervical disc degeneration, unspecified cervical region: Secondary | ICD-10-CM | POA: Diagnosis not present

## 2015-11-29 DIAGNOSIS — M9971 Connective tissue and disc stenosis of intervertebral foramina of cervical region: Secondary | ICD-10-CM | POA: Diagnosis not present

## 2015-12-10 ENCOUNTER — Other Ambulatory Visit: Payer: Self-pay | Admitting: Internal Medicine

## 2015-12-10 DIAGNOSIS — M4802 Spinal stenosis, cervical region: Secondary | ICD-10-CM | POA: Insufficient documentation

## 2015-12-10 DIAGNOSIS — M9981 Other biomechanical lesions of cervical region: Secondary | ICD-10-CM

## 2015-12-13 ENCOUNTER — Telehealth: Payer: Self-pay

## 2015-12-13 NOTE — Telephone Encounter (Signed)
Calling to see why he has no Neuro appt. I gave to [facility] to check on. [facility]

## 2015-12-17 ENCOUNTER — Other Ambulatory Visit: Payer: Medicare Other

## 2015-12-24 DIAGNOSIS — M4722 Other spondylosis with radiculopathy, cervical region: Secondary | ICD-10-CM | POA: Diagnosis not present

## 2015-12-31 DIAGNOSIS — H401131 Primary open-angle glaucoma, bilateral, mild stage: Secondary | ICD-10-CM | POA: Diagnosis not present

## 2015-12-31 DIAGNOSIS — H4010X0 Unspecified open-angle glaucoma, stage unspecified: Secondary | ICD-10-CM | POA: Diagnosis not present

## 2016-01-07 DIAGNOSIS — M4722 Other spondylosis with radiculopathy, cervical region: Secondary | ICD-10-CM | POA: Diagnosis not present

## 2016-01-08 DIAGNOSIS — H2513 Age-related nuclear cataract, bilateral: Secondary | ICD-10-CM | POA: Diagnosis not present

## 2016-01-09 ENCOUNTER — Encounter: Payer: Self-pay | Admitting: Internal Medicine

## 2016-01-09 LAB — HM DIABETES EYE EXAM

## 2016-01-16 ENCOUNTER — Encounter: Payer: Self-pay | Admitting: Internal Medicine

## 2016-01-16 ENCOUNTER — Ambulatory Visit (INDEPENDENT_AMBULATORY_CARE_PROVIDER_SITE_OTHER): Payer: Medicare Other | Admitting: Internal Medicine

## 2016-01-16 VITALS — BP 120/80 | HR 78 | Resp 16 | Ht 62.0 in | Wt 155.2 lb

## 2016-01-16 DIAGNOSIS — M5412 Radiculopathy, cervical region: Secondary | ICD-10-CM | POA: Diagnosis not present

## 2016-01-16 DIAGNOSIS — IMO0001 Reserved for inherently not codable concepts without codable children: Secondary | ICD-10-CM

## 2016-01-16 DIAGNOSIS — E1165 Type 2 diabetes mellitus with hyperglycemia: Secondary | ICD-10-CM | POA: Diagnosis not present

## 2016-01-16 DIAGNOSIS — I10 Essential (primary) hypertension: Secondary | ICD-10-CM

## 2016-01-16 NOTE — Progress Notes (Signed)
Date:  01/16/2016   Name:  Daniel Bell   DOB:  March 13, 1961   MRN:  [id number]   Chief Complaint: Diabetes (follow up ) Diabetes  He presents for his follow-up diabetic visit. He has type 2 diabetes mellitus. His disease course has been improving. Pertinent negatives for hypoglycemia include no dizziness or headaches. Pertinent negatives for diabetes include no chest pain, no foot paresthesias and no weight loss. Symptoms are stable. Pertinent negatives for diabetic complications include no CVA, nephropathy or peripheral neuropathy. Current diabetic treatment includes oral agent (triple therapy).  Hypertension  This is a chronic problem. The current episode started more than 1 year ago. The problem is unchanged. The problem is controlled. Pertinent negatives include no chest pain, headaches, neck pain, palpitations or shortness of breath. There is no history of CVA.  He is on 4 agents for DM.  He does not check his sugar.  His weight is up and down.  He consumes rice and tortillas regularly.  He is resistant to starting injectable medication.  C8-T1 nerve impingement - this is improving.  His neck pain has resolved.  He just has mild tingling in his right 5th finger later in the day.  Maybe very mild grip weakness.  He is taking Advil three times a day.  He has follow up with Neurosurgery next week.  Lab Results  Component Value Date   HGBA1C 8.7 (H) 10/16/2015     Review of Systems  Constitutional: Positive for unexpected weight change (up 6 lbs). Negative for chills, diaphoresis, fever and weight loss.  HENT: Positive for hearing loss.   Eyes: Negative for visual disturbance.  Respiratory: Negative for cough, chest tightness, shortness of breath and wheezing.   Cardiovascular: Negative for chest pain, palpitations and leg swelling.  Gastrointestinal: Negative for abdominal pain and blood in stool.  Genitourinary: Negative for hematuria and urgency.  Musculoskeletal: Negative for back  pain, neck pain and neck stiffness.  Neurological: Negative for dizziness and headaches.  Hematological: Negative for adenopathy.    Patient Active Problem List   Diagnosis Date Noted  . Cervical stenosis of spinal canal 12/10/2015  . Neural foraminal stenosis of cervical spine 12/10/2015  . Cervical radiculopathy 11/22/2015  . Diabetes mellitus type 2, uncontrolled, without complications ([place]) 80/32/1224  . Essential (primary) hypertension 11/22/2014  . Glaucoma 11/22/2014  . Bilateral hearing loss 11/22/2014  . Hyperlipidemia 11/22/2014    Prior to Admission medications   Medication Sig Start Date End Date Taking? Authorizing Provider  ACCU-CHEK FASTCLIX LANCETS MISC 2 (two) times daily. for testing 08/31/14  Yes Historical Provider, MD  ACCU-CHEK SMARTVIEW test strip 1 each by Other route 2 (two) times daily as needed. 11/22/14  Yes Adline Potter, MD  amLODipine-benazepril (LOTREL) 5-20 MG capsule Take 1 capsule by mouth daily. 10/16/15  Yes Adline Potter, MD  aspirin 81 MG tablet Take 1 tablet (81 mg total) by mouth daily. 06/17/15  Yes Adline Potter, MD  Blood Glucose Monitoring Suppl (ACCU-CHEK NANO SMARTVIEW) W/DEVICE KIT  08/30/14  Yes Historical Provider, MD  Coenzyme Q10 (MEGA COQ10) 400 MG CAPS Take 1 capsule by mouth daily.   Yes Historical Provider, MD  diphenhydramine-acetaminophen (TYLENOL PM) 25-500 MG TABS tablet Take 1 tablet by mouth at bedtime as needed.   Yes Historical Provider, MD  glipiZIDE (GLUCOTROL) 5 MG tablet Take 1 tablet (5 mg total) by mouth 2 (two) times daily before a meal. 10/16/15  Yes Adline Potter, MD  latanoprost (XALATAN) 0.005 % ophthalmic  solution Place 1 drop into both eyes at bedtime. 06/17/15  Yes Adline Potter, MD  metFORMIN (GLUCOPHAGE) 1000 MG tablet Take 1 tablet (1,000 mg total) by mouth 2 (two) times daily. 10/16/15  Yes Adline Potter, MD  naproxen (NAPROSYN) 500 MG tablet Take 1 tablet (500 mg total) by mouth 2 (two) times daily. 11/20/15  Yes  Paulina Fusi, MD  pioglitazone (ACTOS) 30 MG tablet Take 1 tablet (30 mg total) by mouth daily. 10/16/15  Yes Adline Potter, MD  simvastatin (ZOCOR) 40 MG tablet Take 1 tablet (40 mg total) by mouth daily. 10/16/15  Yes Adline Potter, MD  sitaGLIPtin (JANUVIA) 100 MG tablet Take 1 tablet (100 mg total) by mouth daily. 10/16/15  Yes Adline Potter, MD    No Known Allergies  History reviewed. No pertinent surgical history.  Social History  Substance Use Topics  . Smoking status: Current Some Day Smoker    Types: Cigars  . Smokeless tobacco: Never Used  . Alcohol use No     Medication list has been reviewed and updated.   Physical Exam  Constitutional: He is oriented to person, place, and time. He appears well-developed. No distress.  HENT:  Head: Normocephalic and atraumatic.  Cardiovascular: Normal rate, regular rhythm and normal heart sounds.   Pulmonary/Chest: Effort normal. No respiratory distress. He has decreased breath sounds in the right upper field and the left upper field. He has no wheezes. He has no rhonchi.  Musculoskeletal: He exhibits no edema.  Neurological: He is alert and oriented to person, place, and time. He has normal strength. No sensory deficit.  Skin: Skin is warm and dry. No rash noted.  Psychiatric: He has a normal mood and affect. His behavior is normal. Thought content normal.  Nursing note and vitals reviewed.   BP 120/80 (BP Location: Left Arm, Patient Position: Sitting, Cuff Size: Normal)   Pulse 78   Resp 16   Ht _0  (1.575 m)   Wt 155 lb 3.2 oz (70.4 kg)   SpO2 100%   BMI 28.39 kg/m   Assessment and Plan: 1. Uncontrolled type 2 diabetes mellitus without complication, without long-term current use of insulin ([place]) Discussed need for tighter control - will work on 10 lb weight loss and reassess next visit if A1C is stable or improved Consider once weekly injectable - Hemoglobin A1c  2. Essential (primary) hypertension controlled  3.  Cervical radiculopathy Improving - follow up with Neurosurgery Continue Advil   [name], MD [facility]  01/16/2016

## 2016-01-17 LAB — HEMOGLOBIN A1C
Est. average glucose Bld gHb Est-mCnc: 192 mg/dL
HEMOGLOBIN A1C: 8.3 % — AB (ref 4.8–5.6)

## 2016-05-19 ENCOUNTER — Ambulatory Visit: Payer: Medicare Other | Admitting: Internal Medicine

## 2016-05-21 ENCOUNTER — Ambulatory Visit: Payer: Medicare Other | Admitting: Internal Medicine

## 2016-06-03 ENCOUNTER — Ambulatory Visit (INDEPENDENT_AMBULATORY_CARE_PROVIDER_SITE_OTHER): Payer: Medicare Other | Admitting: Internal Medicine

## 2016-06-03 ENCOUNTER — Encounter: Payer: Self-pay | Admitting: Internal Medicine

## 2016-06-03 VITALS — BP 124/86 | HR 86 | Temp 97.6°F | Ht 62.0 in | Wt 154.0 lb

## 2016-06-03 DIAGNOSIS — I10 Essential (primary) hypertension: Secondary | ICD-10-CM

## 2016-06-03 DIAGNOSIS — M5412 Radiculopathy, cervical region: Secondary | ICD-10-CM

## 2016-06-03 DIAGNOSIS — E1165 Type 2 diabetes mellitus with hyperglycemia: Secondary | ICD-10-CM | POA: Diagnosis not present

## 2016-06-03 DIAGNOSIS — R599 Enlarged lymph nodes, unspecified: Secondary | ICD-10-CM

## 2016-06-03 DIAGNOSIS — IMO0001 Reserved for inherently not codable concepts without codable children: Secondary | ICD-10-CM

## 2016-06-03 DIAGNOSIS — E782 Mixed hyperlipidemia: Secondary | ICD-10-CM

## 2016-06-03 MED ORDER — AMOXICILLIN-POT CLAVULANATE 875-125 MG PO TABS: 1.0000 | ORAL_TABLET | Freq: Two times a day (BID) | ORAL | 0 refills | Status: DC

## 2016-06-03 NOTE — Progress Notes (Signed)
Date:  06/03/2016   Name:  Daniel Bell   DOB:  11/17/1960   MRN:  [id number]   Chief Complaint: Diabetes; Hyperlipidemia; and Hypertension Diabetes  He presents for his follow-up diabetic visit. He has type 2 diabetes mellitus. His disease course has been improving. There are no hypoglycemic associated symptoms. Pertinent negatives for hypoglycemia include no headaches or tremors. Pertinent negatives for diabetes include no chest pain, no fatigue, no polydipsia, no polyuria and no weakness. Symptoms are stable. Current diabetic treatment includes oral agent (triple therapy) (actos, metformin, januvia, glipizide). He is compliant with treatment most of the time. An ACE inhibitor/angiotensin II receptor blocker is being taken.  Hyperlipidemia  This is a chronic problem. The problem is controlled. Pertinent negatives include no chest pain or shortness of breath. Current antihyperlipidemic treatment includes statins.  Hypertension  This is a chronic problem. The current episode started more than 1 year ago. The problem is unchanged. The problem is controlled. Pertinent negatives include no chest pain, headaches, neck pain, palpitations or shortness of breath.  Sinus Problem  This is a new problem. The current episode started 1 to 4 weeks ago. The problem has been gradually worsening since onset. There has been no fever. Associated symptoms include congestion, sneezing and swollen glands. Pertinent negatives include no coughing, headaches, neck pain, shortness of breath, sinus pressure or sore throat.  Cervical Radiculopathy - he was seen by Neurosurgery and treated with medication - several courses of what I assume is prednisone.  He is much better and no longer sees the specialist.  He is not bothered at all while working but sometimes has mild numbness in 5th finger while watching TV at night.  Lab Results  Component Value Date   HGBA1C 8.3 (H) 01/16/2016    Review of Systems  Constitutional:  Negative for appetite change, fatigue and unexpected weight change.  HENT: Positive for congestion and sneezing. Negative for postnasal drip, sinus pressure and sore throat.   Eyes: Negative for visual disturbance.  Respiratory: Negative for cough, chest tightness, shortness of breath and wheezing.   Cardiovascular: Negative for chest pain, palpitations and leg swelling.  Gastrointestinal: Negative for abdominal pain and blood in stool.  Endocrine: Negative for polydipsia and polyuria.  Genitourinary: Negative for dysuria and hematuria.  Musculoskeletal: Negative for arthralgias and neck pain.  Skin: Negative for color change and rash.  Neurological: Negative for tremors, weakness, numbness and headaches.  Psychiatric/Behavioral: Negative for dysphoric mood.    Patient Active Problem List   Diagnosis Date Noted  . Cervical stenosis of spinal canal 12/10/2015  . Neural foraminal stenosis of cervical spine 12/10/2015  . Cervical radiculopathy 11/22/2015  . Diabetes mellitus type 2, uncontrolled, without complications ([place]) 51/07/5850  . Essential (primary) hypertension 11/22/2014  . Glaucoma 11/22/2014  . Bilateral hearing loss 11/22/2014  . Hyperlipidemia 11/22/2014    Prior to Admission medications   Medication Sig Start Date End Date Taking? Authorizing Provider  ACCU-CHEK FASTCLIX LANCETS MISC 2 (two) times daily. for testing 08/31/14  Yes Historical Provider, MD  ACCU-CHEK SMARTVIEW test strip 1 each by Other route 2 (two) times daily as needed. 11/22/14  Yes Adline Potter, MD  amLODipine-benazepril (LOTREL) 5-20 MG capsule Take 1 capsule by mouth daily. 10/16/15  Yes Adline Potter, MD  aspirin 81 MG tablet Take 1 tablet (81 mg total) by mouth daily. 06/17/15  Yes Adline Potter, MD  Blood Glucose Monitoring Suppl (ACCU-CHEK NANO SMARTVIEW) W/DEVICE KIT  08/30/14  Yes Historical Provider, MD  Coenzyme Q10 (MEGA COQ10) 400 MG CAPS Take 1 capsule by mouth daily.   Yes Historical Provider,  MD  diphenhydramine-acetaminophen (TYLENOL PM) 25-500 MG TABS tablet Take 1 tablet by mouth at bedtime as needed.   Yes Historical Provider, MD  glipiZIDE (GLUCOTROL) 5 MG tablet Take 1 tablet (5 mg total) by mouth 2 (two) times daily before a meal. 10/16/15  Yes Adline Potter, MD  latanoprost (XALATAN) 0.005 % ophthalmic solution Place 1 drop into both eyes at bedtime. 06/17/15  Yes Adline Potter, MD  metFORMIN (GLUCOPHAGE) 1000 MG tablet Take 1 tablet (1,000 mg total) by mouth 2 (two) times daily. 10/16/15  Yes Adline Potter, MD  naproxen (NAPROSYN) 500 MG tablet Take 1 tablet (500 mg total) by mouth 2 (two) times daily. 11/20/15  Yes Paulina Fusi, MD  pioglitazone (ACTOS) 30 MG tablet Take 1 tablet (30 mg total) by mouth daily. 10/16/15  Yes Adline Potter, MD  simvastatin (ZOCOR) 40 MG tablet Take 1 tablet (40 mg total) by mouth daily. 10/16/15  Yes Adline Potter, MD  sitaGLIPtin (JANUVIA) 100 MG tablet Take 1 tablet (100 mg total) by mouth daily. 10/16/15  Yes Adline Potter, MD    No Known Allergies  No past surgical history on file.  Social History  Substance Use Topics  . Smoking status: Current Some Day Smoker    Types: Cigars  . Smokeless tobacco: Never Used  . Alcohol use No     Medication list has been reviewed and updated.   Physical Exam  Constitutional: He is oriented to person, place, and time. He appears well-developed. No distress.  HENT:  Head: Normocephalic and atraumatic.  Right Ear: Tympanic membrane and ear canal normal.  Left Ear: Tympanic membrane and ear canal normal.  Nose: Right sinus exhibits no maxillary sinus tenderness. Left sinus exhibits no maxillary sinus tenderness.  Mouth/Throat: No posterior oropharyngeal edema or posterior oropharyngeal erythema.  Cardiovascular: Normal rate, regular rhythm and normal heart sounds.   Pulmonary/Chest: Effort normal and breath sounds normal. No respiratory distress. He has no decreased breath sounds. He has no wheezes.  He has no rhonchi.  Musculoskeletal: Normal range of motion.  Lymphadenopathy:       Head (left side): Submandibular (1.3 cm firm, non tender ) adenopathy present.    He has no cervical adenopathy.  Neurological: He is alert and oriented to person, place, and time.  Skin: Skin is warm and dry. No rash noted.  Psychiatric: He has a normal mood and affect. His behavior is normal. Thought content normal.  Nursing note and vitals reviewed.   BP 124/86   Pulse 86   Temp 97.6 F (36.4 C)   Ht '5\' 2"'$  (1.575 m)   Wt 154 lb (69.9 kg)   SpO2 98%   BMI 28.17 kg/m   Assessment and Plan: 1. Uncontrolled type 2 diabetes mellitus without complication, without long-term current use of insulin ([facility]) Continue oral agents - Hemoglobin D3O - Basic metabolic panel  2. Essential (primary) hypertension controlled  3. Mixed hyperlipidemia On statin therapy  4. Cervical radiculopathy Improved Follow up with Neurosurgery as needed  5. Lymph node enlargement Concern for malignancy in a smoker but with sinus sx will treat with antibiotics and recheck in 2 weeks US neck if no improvement - CBC with Differential/Platelet - amoxicillin-clavulanate (AUGMENTIN) 875-125 MG tablet; Take 1 tablet by mouth 2 (two) times daily.  Dispense: 20 tablet; Refill: 0   [name], MD [facility]  06/03/2016  

## 2016-06-09 ENCOUNTER — Telehealth: Payer: Self-pay | Admitting: Internal Medicine

## 2016-06-09 NOTE — Telephone Encounter (Signed)
-----   Message from Glean Hess, MD sent at 06/08/2016  7:40 AM EST ----- Patient did not get labs done at his encounter.  Please call and ask why not and encourage him to come get them done.

## 2016-06-09 NOTE — Telephone Encounter (Signed)
Called pt stated went to the lab and told him owed $300.00 since 2016 and not able to afford to pay at this time.

## 2016-06-19 ENCOUNTER — Ambulatory Visit: Payer: Medicare Other | Admitting: Internal Medicine

## 2016-10-01 ENCOUNTER — Encounter: Payer: Self-pay | Admitting: Internal Medicine

## 2016-10-01 ENCOUNTER — Other Ambulatory Visit
Admission: RE | Admit: 2016-10-01 | Discharge: 2016-10-01 | Disposition: A | Discharge status: Home / Self Care | Payer: BC Managed Care – PPO | Source: Ambulatory Visit | Attending: Internal Medicine | Admitting: Internal Medicine

## 2016-10-01 ENCOUNTER — Ambulatory Visit (INDEPENDENT_AMBULATORY_CARE_PROVIDER_SITE_OTHER): Payer: Medicare Other | Admitting: Internal Medicine

## 2016-10-01 VITALS — BP 124/86 | HR 82 | Ht 62.0 in | Wt 152.6 lb

## 2016-10-01 DIAGNOSIS — M5412 Radiculopathy, cervical region: Secondary | ICD-10-CM | POA: Diagnosis not present

## 2016-10-01 DIAGNOSIS — E1165 Type 2 diabetes mellitus with hyperglycemia: Secondary | ICD-10-CM | POA: Diagnosis not present

## 2016-10-01 DIAGNOSIS — I10 Essential (primary) hypertension: Secondary | ICD-10-CM | POA: Insufficient documentation

## 2016-10-01 DIAGNOSIS — E782 Mixed hyperlipidemia: Secondary | ICD-10-CM | POA: Diagnosis not present

## 2016-10-01 DIAGNOSIS — IMO0001 Reserved for inherently not codable concepts without codable children: Secondary | ICD-10-CM

## 2016-10-01 LAB — CBC WITH DIFFERENTIAL/PLATELET
Basophils Absolute: 0.1 10*3/uL (ref 0–0.1)
Basophils Relative: 1 %
Eosinophils Absolute: 0.2 10*3/uL (ref 0–0.7)
Eosinophils Relative: 2 %
HCT: 43.6 % (ref 40.0–52.0)
HEMOGLOBIN: 15.2 g/dL (ref 13.0–18.0)
LYMPHS ABS: 2.5 10*3/uL (ref 1.0–3.6)
Lymphocytes Relative: 31 %
MCH: 30.8 pg (ref 26.0–34.0)
MCHC: 34.8 g/dL (ref 32.0–36.0)
MCV: 88.4 fL (ref 80.0–100.0)
Monocytes Absolute: 0.6 10*3/uL (ref 0.2–1.0)
Monocytes Relative: 8 %
NEUTROS ABS: 4.7 10*3/uL (ref 1.4–6.5)
NEUTROS PCT: 58 %
Platelets: 194 10*3/uL (ref 150–440)
RBC: 4.93 MIL/uL (ref 4.40–5.90)
RDW: 13.5 % (ref 11.5–14.5)
WBC: 8.1 10*3/uL (ref 3.8–10.6)

## 2016-10-01 LAB — COMPREHENSIVE METABOLIC PANEL
ALBUMIN: 4.4 g/dL (ref 3.5–5.0)
ALK PHOS: 113 U/L (ref 38–126)
ALT: 17 U/L (ref 17–63)
ANION GAP: 7 (ref 5–15)
AST: 22 U/L (ref 15–41)
BILIRUBIN TOTAL: 0.8 mg/dL (ref 0.3–1.2)
BUN: 18 mg/dL (ref 6–20)
CALCIUM: 9.4 mg/dL (ref 8.9–10.3)
CO2: 23 mmol/L (ref 22–32)
CREATININE: 0.9 mg/dL (ref 0.61–1.24)
Chloride: 103 mmol/L (ref 101–111)
GFR calc Af Amer: >60 mL/min (ref >60)
GFR calc non Af Amer: >60 mL/min (ref >60)
GLUCOSE: 189 mg/dL — AB (ref 65–99)
Potassium: 4.4 mmol/L (ref 3.5–5.1)
Sodium: 133 mmol/L — ABNORMAL LOW (ref 135–145)
TOTAL PROTEIN: 7.6 g/dL (ref 6.5–8.1)

## 2016-10-01 LAB — LIPID PANEL
Cholesterol: 130 mg/dL (ref 0–200)
HDL: 41 mg/dL (ref >40)
LDL Cholesterol: 66 mg/dL (ref 0–99)
Total CHOL/HDL Ratio: 3.2 RATIO
Triglycerides: 116 mg/dL (ref <150)
VLDL: 23 mg/dL (ref 0–40)

## 2016-10-01 MED ORDER — SITAGLIPTIN PHOSPHATE 100 MG PO TABS: 100.0000 mg | ORAL_TABLET | Freq: Every day | ORAL | 3 refills | Status: DC

## 2016-10-01 MED ORDER — GLIPIZIDE 5 MG PO TABS: 5.0000 mg | ORAL_TABLET | Freq: Two times a day (BID) | ORAL | 3 refills | Status: DC

## 2016-10-01 MED ORDER — METFORMIN HCL 1000 MG PO TABS
1000.0000 mg | ORAL_TABLET | Freq: Two times a day (BID) | ORAL | 3 refills | Status: DC
Start: 2016-10-01 — End: 2017-03-23

## 2016-10-01 MED ORDER — PIOGLITAZONE HCL 30 MG PO TABS: 30.0000 mg | ORAL_TABLET | Freq: Every day | ORAL | 3 refills | Status: DC

## 2016-10-01 MED ORDER — AMLODIPINE BESY-BENAZEPRIL HCL 5-20 MG PO CAPS: 1.0000 | ORAL_CAPSULE | Freq: Every day | ORAL | 3 refills | Status: DC

## 2016-10-01 MED ORDER — SIMVASTATIN 40 MG PO TABS: 40.0000 mg | ORAL_TABLET | Freq: Every day | ORAL | 3 refills | Status: DC

## 2016-10-01 NOTE — Progress Notes (Signed)
Date:  10/01/2016   Name:  Daniel Bell   DOB:  1960-12-20   MRN:  [id number]   Chief Complaint: Diabetes (Need med refills. ) Diabetes  He presents for his follow-up diabetic visit. He has type 2 diabetes mellitus. His disease course has been stable. Pertinent negatives for hypoglycemia include no dizziness or headaches. Pertinent negatives for diabetes include no chest pain, no polydipsia and no polyuria. Current diabetic treatments: on 4 oral meds. His weight is stable. He is following a generally healthy diet. There is no compliance with monitoring of blood glucose. An ACE inhibitor/angiotensin II receptor blocker is being taken.  Hypertension  This is a chronic problem. The problem is unchanged. The problem is controlled. Pertinent negatives include no chest pain, headaches, palpitations or shortness of breath.  Hyperlipidemia  This is a chronic problem. The problem is controlled. Pertinent negatives include no chest pain or shortness of breath. Current antihyperlipidemic treatment includes statins. The current treatment provides significant improvement of lipids. There are no compliance problems.  Risk factors for coronary artery disease include diabetes mellitus, dyslipidemia and hypertension.  He did not get labs done last visit at [facility]. He has some trouble bending down to clean his feet due to back problems so uses a scrubber.  His wife looks at his feet regularly.  Lab Results  Component Value Date   HGBA1C 8.3 (H) 01/16/2016     Review of Systems  Constitutional: Negative for appetite change and unexpected weight change.  Eyes: Negative for visual disturbance.  Respiratory: Negative for cough, chest tightness and shortness of breath.   Cardiovascular: Negative for chest pain and palpitations.  Gastrointestinal: Negative for abdominal pain and constipation.  Endocrine: Negative for polydipsia and polyuria.  Genitourinary: Negative for dysuria.  Musculoskeletal: Positive for  arthralgias and back pain (limited ROM).  Skin: Negative for color change, rash and wound.  Neurological: Negative for dizziness and headaches.  Psychiatric/Behavioral: Negative for dysphoric mood.    Patient Active Problem List   Diagnosis Date Noted  . Cervical stenosis of spinal canal 12/10/2015  . Neural foraminal stenosis of cervical spine 12/10/2015  . Cervical radiculopathy 11/22/2015  . Diabetes mellitus type 2, uncontrolled, without complications ([place]) 22/48/2500  . Essential (primary) hypertension 11/22/2014  . Glaucoma 11/22/2014  . Bilateral hearing loss 11/22/2014  . Hyperlipidemia 11/22/2014    Prior to Admission medications   Medication Sig Start Date End Date Taking? Authorizing Provider  ACCU-CHEK FASTCLIX LANCETS MISC 2 (two) times daily. for testing 08/31/14  Yes Historical Provider, MD  ACCU-CHEK SMARTVIEW test strip 1 each by Other route 2 (two) times daily as needed. 11/22/14  Yes Adline Potter, MD  amLODipine-benazepril (LOTREL) 5-20 MG capsule Take 1 capsule by mouth daily. 10/16/15  Yes Adline Potter, MD  aspirin 81 MG tablet Take 1 tablet (81 mg total) by mouth daily. 06/17/15  Yes Adline Potter, MD  Blood Glucose Monitoring Suppl (ACCU-CHEK NANO SMARTVIEW) W/DEVICE KIT  08/30/14  Yes Historical Provider, MD  Coenzyme Q10 (MEGA COQ10) 400 MG CAPS Take 1 capsule by mouth daily.   Yes Historical Provider, MD  diphenhydramine-acetaminophen (TYLENOL PM) 25-500 MG TABS tablet Take 1 tablet by mouth at bedtime as needed.   Yes Historical Provider, MD  glipiZIDE (GLUCOTROL) 5 MG tablet Take 1 tablet (5 mg total) by mouth 2 (two) times daily before a meal. 10/16/15  Yes Adline Potter, MD  metFORMIN (GLUCOPHAGE) 1000 MG tablet Take 1 tablet (1,000 mg total) by mouth 2 (two) times  daily. 10/16/15  Yes Adline Potter, MD  naproxen (NAPROSYN) 500 MG tablet Take 1 tablet (500 mg total) by mouth 2 (two) times daily. 11/20/15  Yes Paulina Fusi, MD  pioglitazone (ACTOS) 30 MG tablet  Take 1 tablet (30 mg total) by mouth daily. 10/16/15  Yes Adline Potter, MD  simvastatin (ZOCOR) 40 MG tablet Take 1 tablet (40 mg total) by mouth daily. 10/16/15  Yes Adline Potter, MD  sitaGLIPtin (JANUVIA) 100 MG tablet Take 1 tablet (100 mg total) by mouth daily. 10/16/15  Yes Adline Potter, MD    No Known Allergies  History reviewed. No pertinent surgical history.  Social History  Substance Use Topics  . Smoking status: Current Some Day Smoker    Types: Cigars  . Smokeless tobacco: Never Used  . Alcohol use No     Medication list has been reviewed and updated.   Physical Exam  Constitutional: He is oriented to person, place, and time. He appears well-developed. No distress.  HENT:  Head: Normocephalic and atraumatic.  Pulmonary/Chest: Effort normal. No respiratory distress.  Musculoskeletal:       Lumbar back: He exhibits decreased range of motion.  Neurological: He is alert and oriented to person, place, and time.  Skin: Skin is warm and dry. No rash noted.  Psychiatric: He has a normal mood and affect. His behavior is normal. Thought content normal.  Nursing note and vitals reviewed.   BP 124/86 (BP Location: Right Arm, Patient Position: Sitting, Cuff Size: Normal)   Pulse 82   Ht 5' 2"  (1.575 m)   Wt 152 lb 9.6 oz (69.2 kg)   SpO2 100%   BMI 27.91 kg/m   Assessment and Plan: 1. Essential (primary) hypertension controlled - Comprehensive metabolic panel - CBC with Differential/Platelet  2. Uncontrolled type 2 diabetes mellitus without complication, without long-term current use of insulin (HCC) Continue 4 meds - check labs and advise - Hemoglobin A1c  3. Cervical radiculopathy stable  4. Mixed hyperlipidemia On statin therapy - Lipid panel   Meds ordered this encounter  Medications  . sitaGLIPtin (JANUVIA) 100 MG tablet    Sig: Take 1 tablet (100 mg total) by mouth daily.    Dispense:  90 tablet    Refill:  3  . simvastatin (ZOCOR) 40 MG tablet     Sig: Take 1 tablet (40 mg total) by mouth daily.    Dispense:  90 tablet    Refill:  3  . pioglitazone (ACTOS) 30 MG tablet    Sig: Take 1 tablet (30 mg total) by mouth daily.    Dispense:  90 tablet    Refill:  3  . metFORMIN (GLUCOPHAGE) 1000 MG tablet    Sig: Take 1 tablet (1,000 mg total) by mouth 2 (two) times daily.    Dispense:  180 tablet    Refill:  3  . glipiZIDE (GLUCOTROL) 5 MG tablet    Sig: Take 1 tablet (5 mg total) by mouth 2 (two) times daily before a meal.    Dispense:  180 tablet    Refill:  3  . amLODipine-benazepril (LOTREL) 5-20 MG capsule    Sig: Take 1 capsule by mouth daily.    Dispense:  90 capsule    Refill:  Ingram, MD [facility]  10/01/2016

## 2016-10-02 LAB — HEMOGLOBIN A1C
Hgb A1c MFr Bld: 8.1 % — ABNORMAL HIGH (ref 4.8–5.6)
MEAN PLASMA GLUCOSE: 186 mg/dL

## 2016-10-23 IMAGING — CR DG CERVICAL SPINE COMPLETE 4+V
7 series · 9 of 9 positions shown · non-contrast
Comparison: None.

CLINICAL DATA: Right neck pain with numbness in the right hand for
1-1/2 weeks, no injury

EXAM:
CERVICAL SPINE - COMPLETE 4+ VIEW

[c-spine lat]
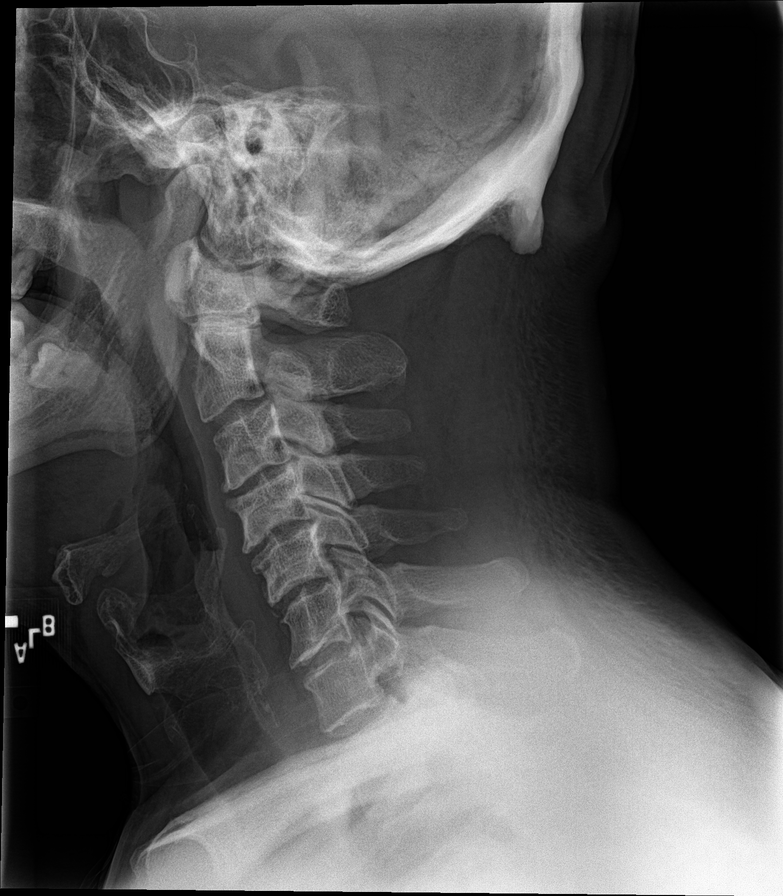

[Series 2: c-spine obl · 0.14mm/px · 2 of 2 slices shown (1 of 2)]
[im 1/2]
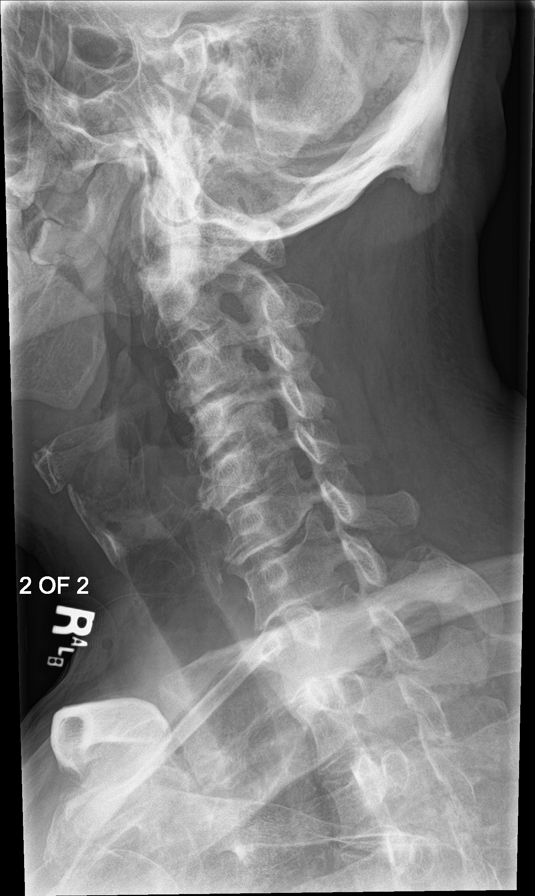
[im 2/2]
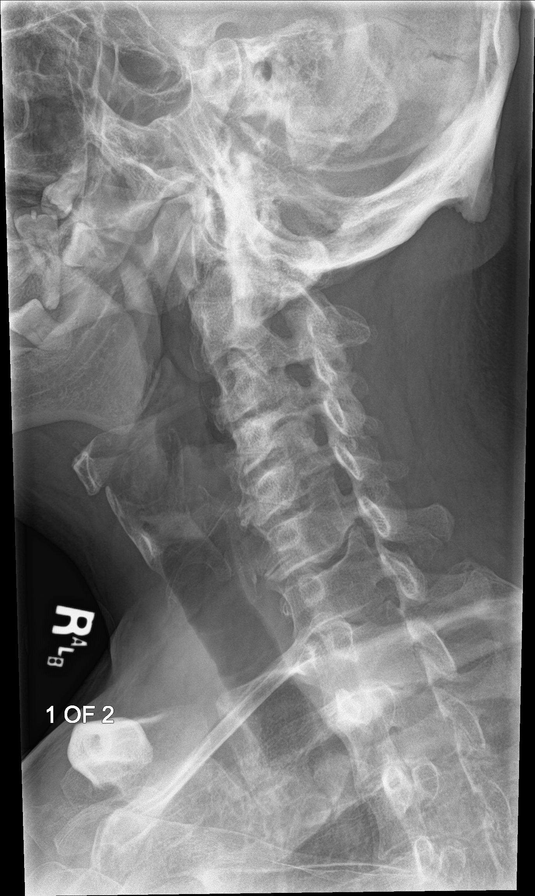

[c-spine obl (2 of 2)]
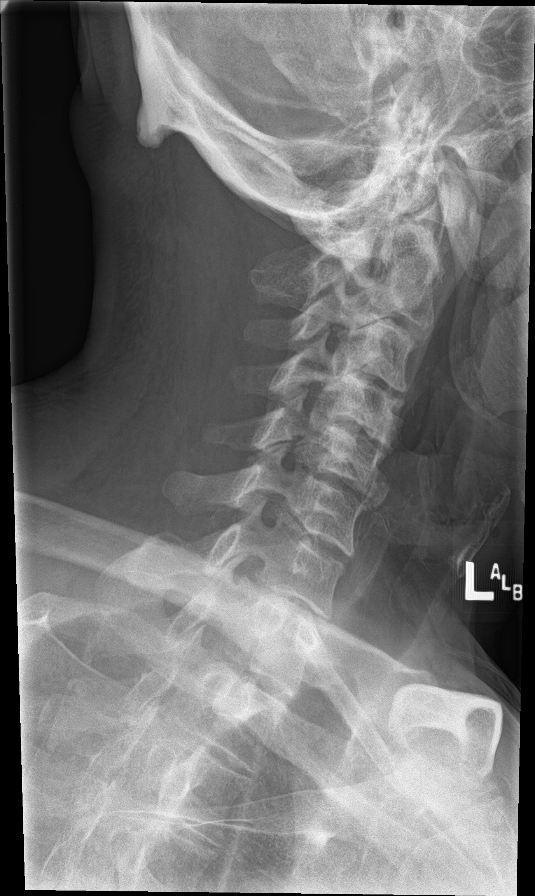

[c-spine ap (1 of 2)]
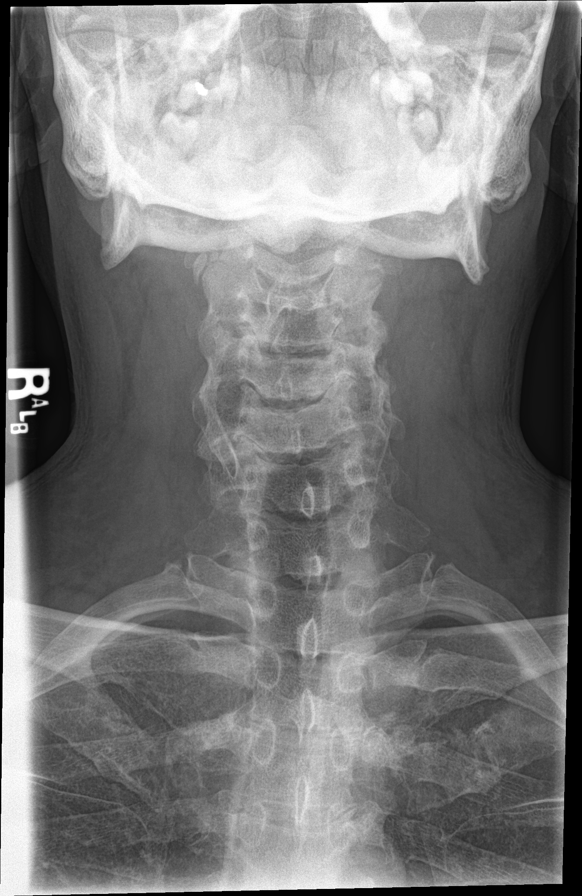

[Series 5: c-spine open mouth · 0.14mm/px · 2 of 2 slices shown]
[im 1/2]
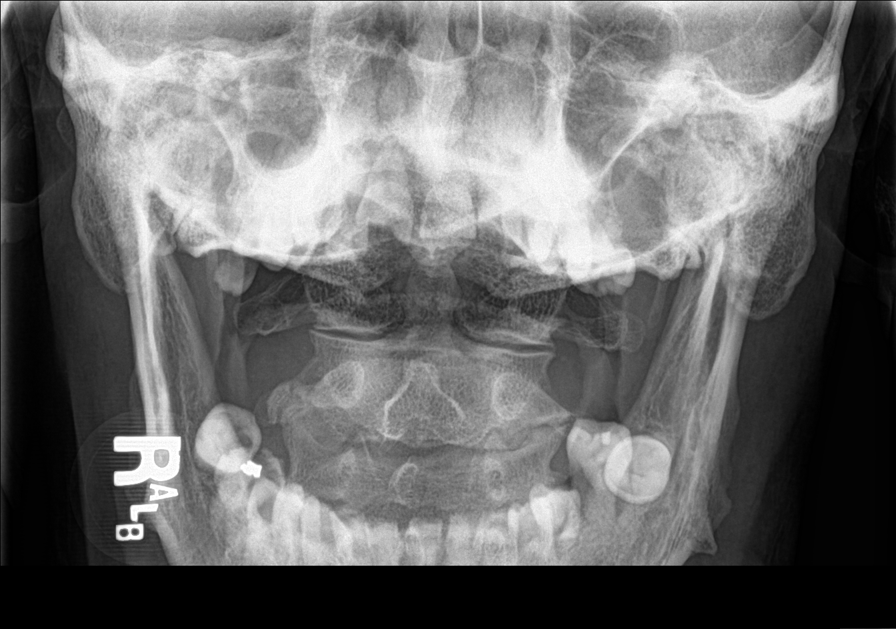
[im 2/2]
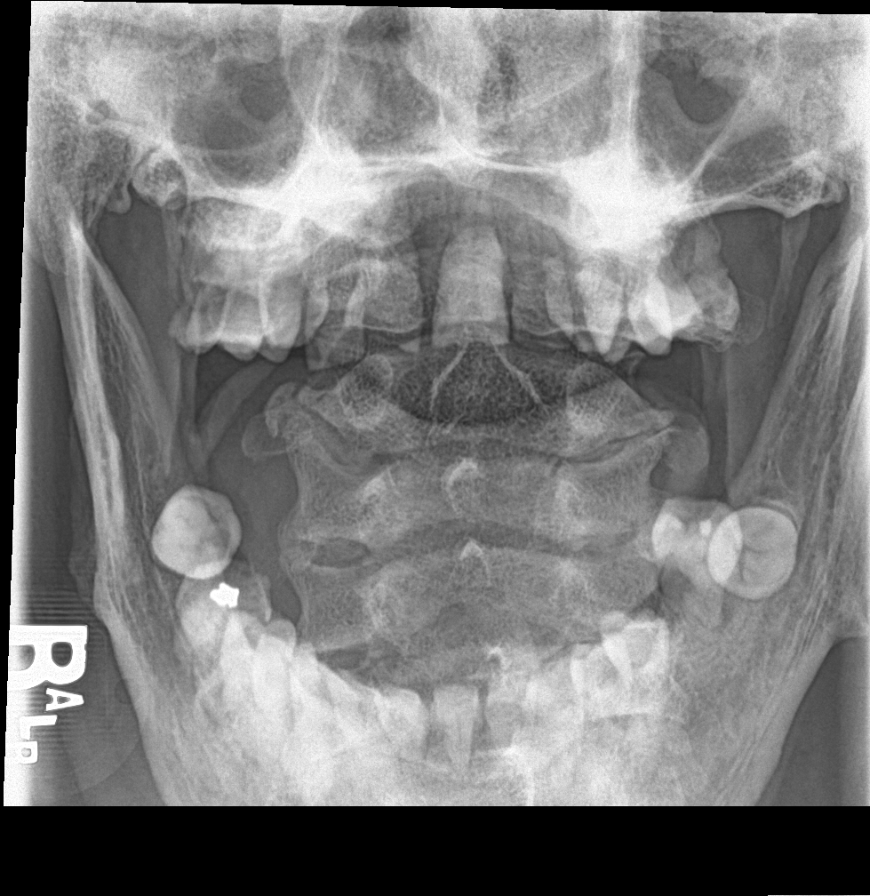

[c-spine ap (2 of 2)]
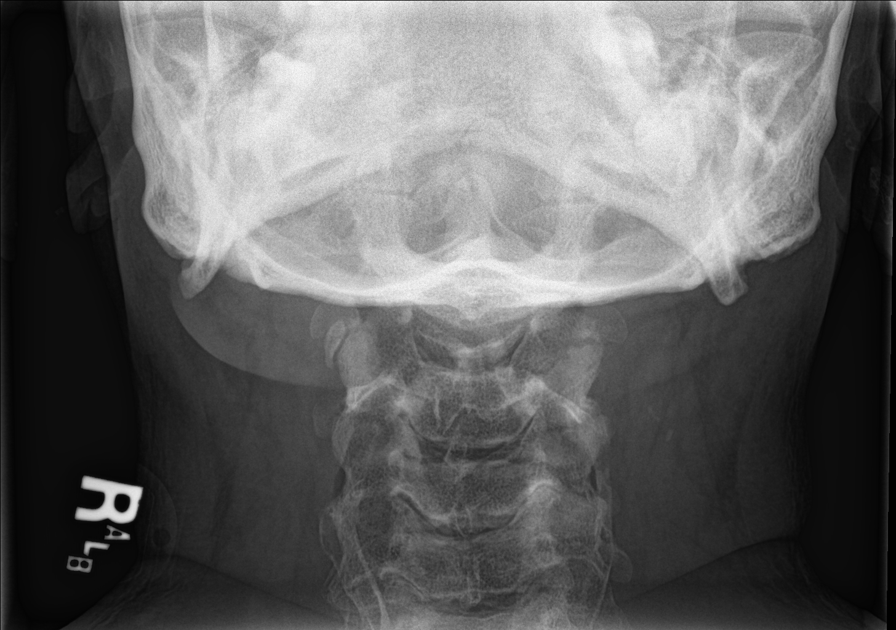

[ct-spine swimmers]
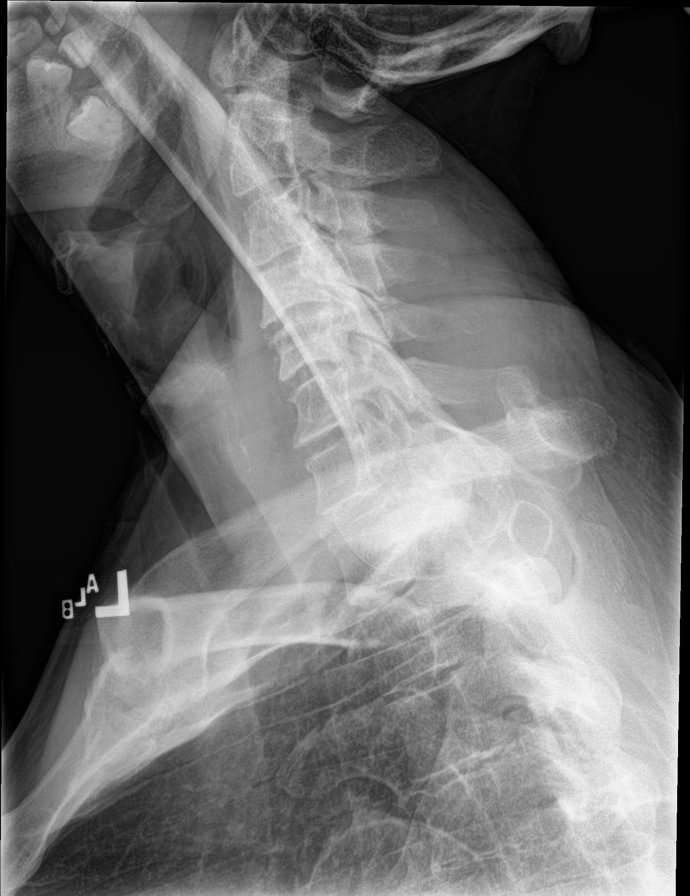

[9 of 9 positions shown; findings below may reference images not displayed]

FINDINGS: The cervical vertebrae are straightened in alignment. There is
degenerative disc disease at C3-4, C4-5, and C5-6 levels where there
is some loss of intervertebral disc space and sclerosis with
spurring. No prevertebral soft tissue swelling is seen. On oblique
views, there is mild foraminal narrowing at C5-6 and C6-7 levels.
The odontoid process is not well seen but grossly is intact and the
lung apices are clear.
IMPRESSION: Straightened alignment with degenerative disc disease from C3-C6.
Mild foraminal narrowing at C5-6 and C6-7 levels.

## 2017-02-03 ENCOUNTER — Ambulatory Visit: Payer: Medicare Other | Admitting: Internal Medicine

## 2017-03-23 ENCOUNTER — Ambulatory Visit (INDEPENDENT_AMBULATORY_CARE_PROVIDER_SITE_OTHER): Payer: Medicare Other | Admitting: Internal Medicine

## 2017-03-23 ENCOUNTER — Other Ambulatory Visit
Admission: RE | Admit: 2017-03-23 | Discharge: 2017-03-23 | Disposition: A | Discharge status: Home / Self Care | Payer: Medicare Other | Source: Ambulatory Visit | Attending: Internal Medicine | Admitting: Internal Medicine

## 2017-03-23 ENCOUNTER — Encounter: Payer: Self-pay | Admitting: Internal Medicine

## 2017-03-23 VITALS — BP 116/68 | HR 83 | Ht 62.0 in | Wt 159.0 lb

## 2017-03-23 DIAGNOSIS — Z23 Encounter for immunization: Secondary | ICD-10-CM

## 2017-03-23 DIAGNOSIS — I10 Essential (primary) hypertension: Secondary | ICD-10-CM

## 2017-03-23 DIAGNOSIS — F329 Major depressive disorder, single episode, unspecified: Secondary | ICD-10-CM | POA: Diagnosis not present

## 2017-03-23 DIAGNOSIS — E1165 Type 2 diabetes mellitus with hyperglycemia: Secondary | ICD-10-CM

## 2017-03-23 DIAGNOSIS — E119 Type 2 diabetes mellitus without complications: Secondary | ICD-10-CM | POA: Insufficient documentation

## 2017-03-23 DIAGNOSIS — IMO0001 Reserved for inherently not codable concepts without codable children: Secondary | ICD-10-CM

## 2017-03-23 LAB — COMPREHENSIVE METABOLIC PANEL
ALT: 25 U/L (ref 17–63)
AST: 22 U/L (ref 15–41)
Albumin: 4.6 g/dL (ref 3.5–5.0)
Alkaline Phosphatase: 105 U/L (ref 38–126)
Anion gap: 8 (ref 5–15)
BILIRUBIN TOTAL: 1.1 mg/dL (ref 0.3–1.2)
BUN: 17 mg/dL (ref 6–20)
CO2: 27 mmol/L (ref 22–32)
CREATININE: 0.93 mg/dL (ref 0.61–1.24)
Calcium: 9.3 mg/dL (ref 8.9–10.3)
Chloride: 99 mmol/L — ABNORMAL LOW (ref 101–111)
GFR calc Af Amer: >60 mL/min (ref >60)
GFR calc non Af Amer: >60 mL/min (ref >60)
GLUCOSE: 198 mg/dL — AB (ref 65–99)
Potassium: 4.8 mmol/L (ref 3.5–5.1)
Sodium: 134 mmol/L — ABNORMAL LOW (ref 135–145)
TOTAL PROTEIN: 7.4 g/dL (ref 6.5–8.1)

## 2017-03-23 MED ORDER — PIOGLITAZONE HCL 30 MG PO TABS: 30.0000 mg | ORAL_TABLET | Freq: Every day | ORAL | 3 refills | Status: DC

## 2017-03-23 MED ORDER — SIMVASTATIN 40 MG PO TABS: 40.0000 mg | ORAL_TABLET | Freq: Every day | ORAL | 3 refills | Status: DC

## 2017-03-23 MED ORDER — SITAGLIPTIN PHOSPHATE 100 MG PO TABS: 100.0000 mg | ORAL_TABLET | Freq: Every day | ORAL | 3 refills | Status: DC

## 2017-03-23 MED ORDER — METFORMIN HCL 1000 MG PO TABS: 1000.0000 mg | ORAL_TABLET | Freq: Two times a day (BID) | ORAL | 3 refills | Status: DC

## 2017-03-23 MED ORDER — GLIPIZIDE 5 MG PO TABS: 5.0000 mg | ORAL_TABLET | Freq: Two times a day (BID) | ORAL | 3 refills | Status: DC

## 2017-03-23 MED ORDER — AMLODIPINE BESY-BENAZEPRIL HCL 5-20 MG PO CAPS: 1.0000 | ORAL_CAPSULE | Freq: Every day | ORAL | 3 refills | Status: DC

## 2017-03-23 NOTE — Progress Notes (Signed)
Date:  03/23/2017   Name:  Daniel Bell   DOB:  1960/10/13   MRN:  [id number]   Chief Complaint: Diabetes; Hypertension; and Immunizations (Flu Shot) Hypertension  This is a chronic problem. The problem is unchanged. The problem is controlled. Pertinent negatives include no chest pain, headaches, palpitations or shortness of breath. Past treatments include calcium channel blockers and ACE inhibitors.  Diabetes  He presents for his follow-up diabetic visit. He has type 2 diabetes mellitus. Pertinent negatives for hypoglycemia include no headaches or tremors. Pertinent negatives for diabetes include no chest pain, no fatigue, no polydipsia and no polyuria. There are no diabetic complications. Current diabetic treatments: 4 agents - glipizide, actos, metformin and januvia. His weight is stable. He is following a generally healthy (but eats a lot of sandwiches and rice) diet. There is no compliance (does not check BS) with monitoring of blood glucose.  He is absolutely against beginning any injectable medications.  Lab Results  Component Value Date   HGBA1C 8.1 (H) 10/01/2016   Lab Results  Component Value Date   CREATININE 0.90 10/01/2016   BUN 18 10/01/2016   NA 133 (L) 10/01/2016   K 4.4 10/01/2016   CL 103 10/01/2016   CO2 23 10/01/2016     Review of Systems  Constitutional: Negative for appetite change, fatigue and unexpected weight change.  Eyes: Negative for visual disturbance.  Respiratory: Negative for cough, shortness of breath and wheezing.   Cardiovascular: Negative for chest pain, palpitations and leg swelling.  Gastrointestinal: Negative for abdominal pain and blood in stool.  Endocrine: Negative for polydipsia and polyuria.  Genitourinary: Negative for dysuria and hematuria.  Skin: Negative for color change and rash.  Neurological: Negative for tremors, numbness and headaches.  Psychiatric/Behavioral: Negative for dysphoric mood.    Patient Active Problem List   Diagnosis Date Noted  . Cervical stenosis of spinal canal 12/10/2015  . Neural foraminal stenosis of cervical spine 12/10/2015  . Cervical radiculopathy 11/22/2015  . Diabetes mellitus type 2, uncontrolled, without complications ([place]) 83/38/2505  . Essential (primary) hypertension 11/22/2014  . Glaucoma 11/22/2014  . Bilateral hearing loss 11/22/2014  . Hyperlipidemia 11/22/2014    Prior to Admission medications   Medication Sig Start Date End Date Taking? Authorizing Provider  ACCU-CHEK FASTCLIX LANCETS MISC 2 (two) times daily. for testing 08/31/14  Yes [provider]  ACCU-CHEK SMARTVIEW test strip 1 each by Other route 2 (two) times daily as needed. 11/22/14  Yes Plonk, Gwyndolyn Saxon, MD  amLODipine-benazepril (LOTREL) 5-20 MG capsule Take 1 capsule by mouth daily. 10/01/16  Yes Glean Hess, MD  aspirin 81 MG tablet Take 1 tablet (81 mg total) by mouth daily. 06/17/15  Yes Plonk, Gwyndolyn Saxon, MD  Blood Glucose Monitoring Suppl (ACCU-CHEK NANO SMARTVIEW) W/DEVICE KIT  08/30/14  Yes [provider]  Coenzyme Q10 (MEGA COQ10) 400 MG CAPS Take 1 capsule by mouth daily.   Yes [provider]  glipiZIDE (GLUCOTROL) 5 MG tablet Take 1 tablet (5 mg total) by mouth 2 (two) times daily before a meal. 10/01/16  Yes Glean Hess, MD  metFORMIN (GLUCOPHAGE) 1000 MG tablet Take 1 tablet (1,000 mg total) by mouth 2 (two) times daily. 10/01/16  Yes Glean Hess, MD  Multiple Vitamins-Minerals (MULTIVITAMIN WITH MINERALS) tablet Take 1 tablet by mouth daily.   Yes [provider]  naproxen (NAPROSYN) 500 MG tablet Take 1 tablet (500 mg total) by mouth 2 (two) times daily. 11/20/15  Yes Posey Pronto, Kirtida,  MD  pioglitazone (ACTOS) 30 MG tablet Take 1 tablet (30 mg total) by mouth daily. 10/01/16  Yes Glean Hess, MD  simvastatin (ZOCOR) 40 MG tablet Take 1 tablet (40 mg total) by mouth daily. 10/01/16  Yes Glean Hess, MD  sitaGLIPtin (JANUVIA) 100 MG tablet Take 1  tablet (100 mg total) by mouth daily. 10/01/16  Yes Glean Hess, MD    No Known Allergies  History reviewed. No pertinent surgical history.  Social History  Substance Use Topics  . Smoking status: Current Some Day Smoker    Types: Cigars  . Smokeless tobacco: Never Used     Comment: 2-3 cigars a day  . Alcohol use No     Medication list has been reviewed and updated.  PHQ 2/9 Scores 03/23/2017 10/16/2015 06/17/2015  PHQ - 2 Score 0 0 0    Physical Exam  Constitutional: He is oriented to person, place, and time. He appears well-developed. No distress.  HENT:  Head: Normocephalic and atraumatic.  Neck: Normal range of motion. Carotid bruit is not present.  Cardiovascular: Normal rate, regular rhythm and normal heart sounds.   Pulmonary/Chest: Effort normal and breath sounds normal. No respiratory distress. He has no wheezes.  Musculoskeletal: Normal range of motion.  Neurological: He is alert and oriented to person, place, and time.  Skin: Skin is warm and dry. No rash noted.  Psychiatric: He has a normal mood and affect. His behavior is normal. Thought content normal.  Nursing note and vitals reviewed.   BP 116/68   Pulse 83   Ht '5\' 2"'$  (1.575 m)   Wt 159 lb (72.1 kg)   SpO2 99%   BMI 29.08 kg/m   Assessment and Plan: 1. Essential (primary) hypertension controlled  2. Diabetes mellitus type 2, uncontrolled, without complications (HCC) Not controlled on 4 agents Patient is adamant about not getting injectable medications Discussed diet in detail - he will cut back on all carbs - Comprehensive metabolic panel - Hemoglobin A1c - Microalbumin / creatinine urine ratio  3. Need for influenza vaccination - Flu Vaccine QUAD 36+ mos IM   Meds ordered this encounter  Medications  . sitaGLIPtin (JANUVIA) 100 MG tablet    Sig: Take 1 tablet (100 mg total) by mouth daily.    Dispense:  90 tablet    Refill:  3  . simvastatin (ZOCOR) 40 MG tablet    Sig: Take 1  tablet (40 mg total) by mouth daily.    Dispense:  90 tablet    Refill:  3  . pioglitazone (ACTOS) 30 MG tablet    Sig: Take 1 tablet (30 mg total) by mouth daily.    Dispense:  90 tablet    Refill:  3  . metFORMIN (GLUCOPHAGE) 1000 MG tablet    Sig: Take 1 tablet (1,000 mg total) by mouth 2 (two) times daily.    Dispense:  180 tablet    Refill:  3  . glipiZIDE (GLUCOTROL) 5 MG tablet    Sig: Take 1 tablet (5 mg total) by mouth 2 (two) times daily before a meal.    Dispense:  180 tablet    Refill:  3  . amLODipine-benazepril (LOTREL) 5-20 MG capsule    Sig: Take 1 capsule by mouth daily.    Dispense:  90 capsule    Refill:  3    Partially dictated using Editor, commissioning. Any errors are unintentional.  [name], MD [facility]  03/23/2017  

## 2017-03-23 NOTE — Patient Instructions (Addendum)
Schedule Eye exam at [facility]  Phone: [phone] Phone: [phone] FAX: [address] [place], [place] [address]   Diabetes Mellitus and Food It is important for you to manage your blood sugar (glucose) level. Your blood glucose level can be greatly affected by what you eat. Eating healthier foods in the appropriate amounts throughout the day at about the same time each day will help you control your blood glucose level. It can also help slow or prevent worsening of your diabetes mellitus. Healthy eating may even help you improve the level of your blood pressure and reach or maintain a healthy weight. General recommendations for healthful eating and cooking habits include:  Eating meals and snacks regularly. Avoid going long periods of time without eating to lose weight.  Eating a diet that consists mainly of plant-based foods, such as fruits, vegetables, nuts, legumes, and whole grains.  Using low-heat cooking methods, such as baking, instead of high-heat cooking methods, such as deep frying.  Work with your dietitian to make sure you understand how to use the Nutrition Facts information on food labels. How can food affect me? Carbohydrates Carbohydrates affect your blood glucose level more than any other type of food. Your dietitian will help you determine how many carbohydrates to eat at each meal and teach you how to count carbohydrates. Counting carbohydrates is important to keep your blood glucose at a healthy level, especially if you are using insulin or taking certain medicines for diabetes mellitus. Alcohol Alcohol can cause sudden decreases in blood glucose (hypoglycemia), especially if you use insulin or take certain medicines for diabetes mellitus. Hypoglycemia can be a life-threatening condition. Symptoms of hypoglycemia (sleepiness, dizziness, and disorientation) are similar to symptoms of having too much alcohol. If your health  care provider has given you approval to drink alcohol, do so in moderation and use the following guidelines:  Women should not have more than one drink per day, and men should not have more than two drinks per day. One drink is equal to: ? 12 oz of beer. ? 5 oz of wine. ? 1 oz of hard liquor.  Do not drink on an empty stomach.  Keep yourself hydrated. Have water, diet soda, or unsweetened iced tea.  Regular soda, juice, and other mixers might contain a lot of carbohydrates and should be counted.  What foods are not recommended? As you make food choices, it is important to remember that all foods are not the same. Some foods have fewer nutrients per serving than other foods, even though they might have the same number of calories or carbohydrates. It is difficult to get your body what it needs when you eat foods with fewer nutrients. Examples of foods that you should avoid that are high in calories and carbohydrates but low in nutrients include:  Trans fats (most processed foods list trans fats on the Nutrition Facts label).  Regular soda.  Juice.  Candy.  Sweets, such as cake, pie, doughnuts, and cookies.  Fried foods.  What foods can I eat? Eat nutrient-rich foods, which will nourish your body and keep you healthy. The food you should eat also will depend on several factors, including:  The calories you need.  The medicines you take.  Your weight.  Your blood glucose level.  Your blood pressure level.  Your cholesterol level.  You should eat a variety of foods, including:  Protein. ? Lean cuts of meat. ? Proteins low in saturated fats, such  as fish, egg whites, and beans. Avoid processed meats.  Fruits and vegetables. ? Fruits and vegetables that may help control blood glucose levels, such as apples, mangoes, and yams.  Dairy products. ? Choose fat-free or low-fat dairy products, such as milk, yogurt, and cheese.  Grains, bread, pasta, and rice. ? Choose  whole grain products, such as multigrain bread, whole oats, and brown rice. These foods may help control blood pressure.  Fats. ? Foods containing healthful fats, such as nuts, avocado, olive oil, canola oil, and fish.  Does everyone with diabetes mellitus have the same meal plan? Because every person with diabetes mellitus is different, there is not one meal plan that works for everyone. It is very important that you meet with a dietitian who will help you create a meal plan that is just right for you. This information is not intended to replace advice given to you by your health care provider. Make sure you discuss any questions you have with your health care provider. Document Released: 02/12/2005 Document Revised: 10/24/2015 Document Reviewed: 04/14/2013 Elsevier Interactive Patient Education  2017 [employer].

## 2017-03-24 LAB — HEMOGLOBIN A1C
HEMOGLOBIN A1C: 9 % — AB (ref 4.8–5.6)
Mean Plasma Glucose: 212 mg/dL

## 2017-06-02 ENCOUNTER — Encounter: Payer: Self-pay | Admitting: Emergency Medicine

## 2017-06-02 ENCOUNTER — Other Ambulatory Visit: Payer: Self-pay

## 2017-06-02 ENCOUNTER — Ambulatory Visit
Admission: EM | Admit: 2017-06-02 | Discharge: 2017-06-02 | Disposition: A | Discharge status: Home / Self Care | Payer: Medicare Other | Attending: Family Medicine | Admitting: Family Medicine

## 2017-06-02 DIAGNOSIS — G5701 Lesion of sciatic nerve, right lower limb: Secondary | ICD-10-CM | POA: Diagnosis not present

## 2017-06-02 MED ORDER — MELOXICAM 15 MG PO TABS: 15.0000 mg | ORAL_TABLET | Freq: Every day | ORAL | 0 refills | Status: DC | PRN

## 2017-06-02 MED ORDER — CYCLOBENZAPRINE HCL 10 MG PO TABS: 10.0000 mg | ORAL_TABLET | Freq: Three times a day (TID) | ORAL | 0 refills | Status: DC | PRN

## 2017-06-02 NOTE — Discharge Instructions (Signed)
Medications as prescribed.  Exercises as prescribed. Holds are for 3-5 secs. Do each exercise ten times.  Take care  Dr. Lacinda Axon

## 2017-06-02 NOTE — ED Provider Notes (Signed)
[facility]    CSN: [id number] Arrival date & time: 06/02/17  1045  History   Chief Complaint Chief Complaint  Patient presents with  . Back Pain   HPI  57 year old male presents with buttock pain.  Patient reports a 2-10-monthhistory of buttock pain.  Located in the right buttock.  He states that he has no radiation of pain down his leg.  He states that he has no back pain.  He states that he thinks this was brought about by what he does for living.  He is a school bus driver.  He does not sit with his wallet on that side.  His while it is in the front pocket.  He does not recall any other inciting event.  No fall, trauma, injury.  He has been using Tylenol and a heating pad without resolution.  Seems to be worse with sitting.  No reports of paresthesias.  No other associated symptoms.  No other complaints at this time.  Past Medical History:  Diagnosis Date  . Cervical pain   . [place] complication ([place]   . Hyperlipidemia   . Hypertension   . Numbness of finger 5th digit right hand    Patient Active Problem List   Diagnosis Date Noted  . Cervical stenosis of spinal canal 12/10/2015  . Neural foraminal stenosis of cervical spine 12/10/2015  . Cervical radiculopathy 11/22/2015  . Diabetes mellitus type 2, uncontrolled, without complications ([place] [address] 025/85/2778 . Essential (primary) hypertension 11/22/2014  . Glaucoma 11/22/2014  . Bilateral hearing loss 11/22/2014  . Hyperlipidemia 11/22/2014   History reviewed. No pertinent surgical history.  Home Medications    Prior to Admission medications   Medication Sig Start Date End Date Taking? Authorizing Provider  ACCU-CHEK FASTCLIX LANCETS MISC 2 (two) times daily. for testing 08/31/14  Yes [provider]  ACCU-CHEK SMARTVIEW test strip 1 each by Other route 2 (two) times daily as needed. 11/22/14  Yes Plonk, WGwyndolyn Saxon MD  amLODipine-benazepril (LOTREL) 5-20 MG capsule Take 1 capsule by mouth  daily. 03/23/17  Yes BGlean Hess MD  aspirin 81 MG tablet Take 1 tablet (81 mg total) by mouth daily. 06/17/15  Yes Plonk, WGwyndolyn Saxon MD  Blood Glucose Monitoring Suppl (ACCU-CHEK NANO SMARTVIEW) W/DEVICE KIT  08/30/14  Yes [provider]  Coenzyme Q10 (MEGA COQ10) 400 MG CAPS Take 1 capsule by mouth daily.   Yes [provider]  glipiZIDE (GLUCOTROL) 5 MG tablet Take 1 tablet (5 mg total) by mouth 2 (two) times daily before a meal. 03/23/17  Yes BGlean Hess MD  metFORMIN (GLUCOPHAGE) 1000 MG tablet Take 1 tablet (1,000 mg total) by mouth 2 (two) times daily. 03/23/17  Yes BGlean Hess MD  Multiple Vitamins-Minerals (MULTIVITAMIN WITH MINERALS) tablet Take 1 tablet by mouth daily.   Yes [provider]  pioglitazone (ACTOS) 30 MG tablet Take 1 tablet (30 mg total) by mouth daily. 03/23/17  Yes BGlean Hess MD  simvastatin (ZOCOR) 40 MG tablet Take 1 tablet (40 mg total) by mouth daily. 03/23/17  Yes BGlean Hess MD  sitaGLIPtin (JANUVIA) 100 MG tablet Take 1 tablet (100 mg total) by mouth daily. 03/23/17  Yes BGlean Hess MD  cyclobenzaprine (FLEXERIL) 10 MG tablet Take 1 tablet (10 mg total) by mouth 3 (three) times daily as needed for muscle spasms. 06/02/17   CCoral Spikes DO  meloxicam (MOBIC) 15 MG tablet Take 1 tablet (15 mg total) by mouth  daily as needed for pain. 06/02/17   Coral Spikes, DO   Family History Family History  Problem Relation Age of Onset  . Diabetes Mother    Social History Social History   Tobacco Use  . Smoking status: Current Some Day Smoker    Types: Cigars  . Smokeless tobacco: Never Used  . Tobacco comment: 2-3 cigars a day  Substance Use Topics  . Alcohol use: No    Alcohol/week: 0.0 oz  . Drug use: No   Allergies   Patient has no known allergies.  Review of Systems Review of Systems  Constitutional: Negative.   Musculoskeletal:       Buttock pain. Denies back pain.  Neurological:        No numbness or paresthesia.     Physical Exam Triage Vital Signs ED Triage Vitals  Enc Vitals Group     BP 06/02/17 1107 130/82     Pulse Rate 06/02/17 1107 85     Resp 06/02/17 1107 16     Temp 06/02/17 1107 98.1 F (36.7 C)     Temp Source 06/02/17 1107 Oral     SpO2 06/02/17 1107 100 %     Weight 06/02/17 1105 153 lb (69.4 kg)     Height 06/02/17 1105 5' 3"  (1.6 m)     Head Circumference --      Peak Flow --      Pain Score 06/02/17 1106 0     Pain Loc --      Pain Edu? --      Excl. in [place]? --    Updated Vital Signs BP 130/82 (BP Location: Left Arm)   Pulse 85   Temp 98.1 F (36.7 C) (Oral)   Resp 16   Ht 5' 3"  (1.6 m)   Wt 153 lb (69.4 kg)   SpO2 100%   BMI 27.10 kg/m   Physical Exam  Constitutional: He is oriented to person, place, and time. He appears well-developed and well-nourished. No distress.  Cardiovascular: Normal rate and regular rhythm.  No murmur heard. Pulmonary/Chest: Effort normal and breath sounds normal. He has no wheezes. He has no rales.  Musculoskeletal:  Patient with tenderness of the right buttock at the area of the piriformis muscle. Lumbar spine nontender.  Excessive lordosis was noted.  Negative straight leg raise.  Neurological: He is alert and oriented to person, place, and time.  Psychiatric: He has a normal mood and affect. His behavior is normal.  Nursing note and vitals reviewed.  UC Treatments / Results  Labs (all labs ordered are listed, but only abnormal results are displayed) Labs Reviewed - No data to display  EKG  EKG Interpretation None       Radiology No results found.  Procedures Procedures (including critical care time)  Medications Ordered in UC Medications - No data to display   Initial Impression / Assessment and Plan / UC Course  I have reviewed the triage vital signs and the nursing notes.  Pertinent labs & imaging results that were available during my care of the patient were reviewed by me and  considered in my medical decision making (see chart for details).     57 year old male presents with likely piriformis syndrome.  Treating with meloxicam and Flexeril.  Exercises given.  Final Clinical Impressions(s) / UC Diagnoses   Final diagnoses:  Piriformis syndrome of right side    ED Discharge Orders        Ordered  meloxicam (MOBIC) 15 MG tablet  Daily PRN     06/02/17 1142    cyclobenzaprine (FLEXERIL) 10 MG tablet  3 times daily PRN     06/02/17 1142     Controlled Substance Prescriptions [place] Controlled Substance Registry consulted? Not Applicable   Coral Spikes, DO 06/02/17 1200

## 2017-06-02 NOTE — ED Triage Notes (Signed)
Patient c/o pain in his lower back that goes down his right leg for 2 months.

## 2017-06-29 ENCOUNTER — Other Ambulatory Visit: Payer: Self-pay | Admitting: Family Medicine

## 2017-07-15 ENCOUNTER — Telehealth: Payer: Self-pay

## 2017-07-15 NOTE — Telephone Encounter (Signed)
Called pt to sched for AWV w/ [facility]. Unable to reach pt due to invalid phone number.

## 2017-07-19 ENCOUNTER — Other Ambulatory Visit: Payer: Self-pay

## 2017-07-19 ENCOUNTER — Ambulatory Visit
Admission: EM | Admit: 2017-07-19 | Discharge: 2017-07-19 | Disposition: A | Discharge status: Home / Self Care | Payer: BC Managed Care – PPO | Attending: Internal Medicine | Admitting: Internal Medicine

## 2017-07-19 DIAGNOSIS — M25519 Pain in unspecified shoulder: Secondary | ICD-10-CM

## 2017-07-19 DIAGNOSIS — M25512 Pain in left shoulder: Secondary | ICD-10-CM

## 2017-07-19 DIAGNOSIS — M542 Cervicalgia: Secondary | ICD-10-CM | POA: Diagnosis not present

## 2017-07-19 DIAGNOSIS — M25511 Pain in right shoulder: Secondary | ICD-10-CM

## 2017-07-19 MED ORDER — CYCLOBENZAPRINE HCL 10 MG PO TABS: 10.0000 mg | ORAL_TABLET | Freq: Every evening | ORAL | 0 refills | Status: AC | PRN

## 2017-07-19 MED ORDER — NAPROXEN 500 MG PO TABS: 500.0000 mg | ORAL_TABLET | Freq: Two times a day (BID) | ORAL | 0 refills | Status: DC

## 2017-07-19 MED ORDER — KETOROLAC TROMETHAMINE 60 MG/2ML IM SOLN
60.0000 mg | Freq: Once | INTRAMUSCULAR | Status: AC
  Administered 2017-07-19: 60 mg via INTRAMUSCULAR

## 2017-07-19 NOTE — ED Triage Notes (Signed)
Pt states his neck pain started on Thursday after he picked up something at work. Pain radiates into both shoulders. Pain 8/10

## 2017-07-19 NOTE — Discharge Instructions (Addendum)
Anticipate gradual improvement in neck and shoulder pain and stiffness over the next several days.  Injection of ketorolac (anti inflammatory/pain reliever) given at the urgent care with good results.  Ice for 5-10 minutes several times daily may help decrease pain/stiffness. Prescriptions for naproxen (anti inflammatory/pain reliever) and for cyclobenzaprine (muscle relaxer) were sent to the pharmacy.  Note for work given, return to work 2/20.

## 2017-07-19 NOTE — ED Provider Notes (Signed)
[place]    CSN: [id number] Arrival date & time: 07/19/17  1028     History   Chief Complaint Chief Complaint  Patient presents with  . Neck Pain    HPI Daniel Bell is a 57 y.o. male.   He presents today with pain diffusely in his neck and shoulders.  He picked up a 50 pound vacuum backpack 4 days ago, and had a little bit of discomfort in his left shoulder.  2 days later, on 2/16, he woke up with discomfort and stiffness in his bilateral trapezius and neck areas.  He has had some cough and congestion, no fever, not really achy elsewhere.  Not having weakness or clumsiness in his hands or arms, able to fully extend his arms at the shoulders overhead, but this does make his neck and shoulder discomfort worse.  He has not had symptoms of this magnitude before.  He was able to walk into the urgent care independently, and climb onto the exam table.  No change in bowel or bladder function    HPI  Past Medical History:  Diagnosis Date  . Cervical pain   . Diabetes mellitus without complication ([place])   . Hyperlipidemia   . Hypertension   . Numbness of finger 5th digit right hand     Patient Active Problem List   Diagnosis Date Noted  . Cervical stenosis of spinal canal 12/10/2015  . Neural foraminal stenosis of cervical spine 12/10/2015  . Cervical radiculopathy 11/22/2015  . Diabetes mellitus type 2, uncontrolled, without complications ([place]) 62/70/3500  . Essential (primary) hypertension 11/22/2014  . Glaucoma 11/22/2014  . Bilateral hearing loss 11/22/2014  . Hyperlipidemia 11/22/2014    History reviewed. No pertinent surgical history.     Home Medications    Prior to Admission medications   Medication Sig Start Date End Date Taking? Authorizing Provider  ACCU-CHEK FASTCLIX LANCETS MISC 2 (two) times daily. for testing 08/31/14   [provider]  ACCU-CHEK SMARTVIEW test strip 1 each by Other route 2 (two) times daily as needed. 11/22/14   Plonk,  Gwyndolyn Saxon, MD  amLODipine-benazepril (LOTREL) 5-20 MG capsule Take 1 capsule by mouth daily. 03/23/17   Glean Hess, MD  aspirin 81 MG tablet Take 1 tablet (81 mg total) by mouth daily. 06/17/15   Plonk, Gwyndolyn Saxon, MD  Blood Glucose Monitoring Suppl (ACCU-CHEK NANO SMARTVIEW) W/DEVICE KIT  08/30/14   [provider]  Coenzyme Q10 (MEGA COQ10) 400 MG CAPS Take 1 capsule by mouth daily.    [provider]  cyclobenzaprine (FLEXERIL) 10 MG tablet Take 1 tablet (10 mg total) by mouth at bedtime as needed for up to 15 days for muscle spasms. 07/19/17 08/03/17  Wynona Luna, MD  glipiZIDE (GLUCOTROL) 5 MG tablet Take 1 tablet (5 mg total) by mouth 2 (two) times daily before a meal. 03/23/17   Glean Hess, MD  metFORMIN (GLUCOPHAGE) 1000 MG tablet Take 1 tablet (1,000 mg total) by mouth 2 (two) times daily. 03/23/17   Glean Hess, MD  Multiple Vitamins-Minerals (MULTIVITAMIN WITH MINERALS) tablet Take 1 tablet by mouth daily.    [provider]  naproxen (NAPROSYN) 500 MG tablet Take 1 tablet (500 mg total) by mouth 2 (two) times daily. 07/19/17   Wynona Luna, MD  pioglitazone (ACTOS) 30 MG tablet Take 1 tablet (30 mg total) by mouth daily. 03/23/17   Glean Hess, MD  simvastatin (ZOCOR) 40 MG tablet Take 1 tablet (40 mg  total) by mouth daily. 03/23/17   Glean Hess, MD  sitaGLIPtin (JANUVIA) 100 MG tablet Take 1 tablet (100 mg total) by mouth daily. 03/23/17   Glean Hess, MD    Family History Family History  Problem Relation Age of Onset  . Diabetes Mother     Social History Social History   Tobacco Use  . Smoking status: Current Some Day Smoker    Types: Cigars  . Smokeless tobacco: Never Used  . Tobacco comment: 2-3 cigars a day  Substance Use Topics  . Alcohol use: No    Alcohol/week: 0.0 oz  . Drug use: No     Allergies   Patient has no known allergies.   Review of Systems Review of Systems  All other  systems reviewed and are negative.    Physical Exam Triage Vital Signs ED Triage Vitals  Enc Vitals Group     BP 07/19/17 1102 128/84     Pulse Rate 07/19/17 1102 100     Resp 07/19/17 1102 18     Temp 07/19/17 1102 97.9 F (36.6 C)     Temp Source 07/19/17 1102 Oral     SpO2 07/19/17 1102 100 %     Weight 07/19/17 1101 160 lb (72.6 kg)     Height 07/19/17 1101 _0  (1.6 m)     Pain Score 07/19/17 1103 8     Pain Loc --    Updated Vital Signs BP 128/84 (BP Location: Left Arm)   Pulse 100   Temp 97.9 F (36.6 C) (Oral)   Resp 18   Ht _1  (1.6 m)   Wt 160 lb (72.6 kg)   SpO2 100%   BMI 28.34 kg/m   Physical Exam  Constitutional: He is oriented to person, place, and time. No distress.  Alert, nicely groomed  HENT:  Head: Atraumatic.  Eyes:  Conjugate gaze, no eye redness/drainage  Neck: Neck supple.  Patient finds it painful to laterally rotate to the right and left, extend, and flex, although he is able to move his neck in these directions, he is limited.  There is bilateral paracervical spasm, diffusely, and diffuse tenderness to palpation over the posterior lateral neck and trapezius areas.  Cardiovascular: Normal rate and regular rhythm.  Pulmonary/Chest: Effort normal. No respiratory distress. He has no wheezes. He has no rales.      Lungs clear, symmetric breath sounds   Abdominal: He exhibits no distension.  Musculoskeletal: Normal range of motion.  Able to extend both arms at shoulders overhead, but makes neck/shoulder area discomfort more pronounced.  Neurological: He is alert and oriented to person, place, and time.  Walked into the urgent care independently, holding neck/shoulders stiffly, limiting movement.  Able to climb onto exam table.    Skin: Skin is warm and dry.  No cyanosis  Nursing note and vitals reviewed.    UC Treatments / Results   Procedures Procedures (including critical care time)  Medications Ordered in UC Medications    ketorolac (TORADOL) injection 60 mg (60 mg Intramuscular Given 07/19/17 1153)  ice bag applied.     Final Clinical Impressions(s) / UC Diagnoses   Final diagnoses:  Neck and shoulder pain   Anticipate gradual improvement in neck and shoulder pain and stiffness over the next several days.  Injection of ketorolac (anti inflammatory/pain reliever) given at the urgent care with good results.  Ice for 5-10 minutes several times daily may help decrease pain/stiffness. Prescriptions for naproxen (anti inflammatory/pain  reliever) and for cyclobenzaprine (muscle relaxer) were sent to the pharmacy.  Note for work given, return to work 2/20.    ED Discharge Orders        Ordered    naproxen (NAPROSYN) 500 MG tablet  2 times daily     07/19/17 1253    cyclobenzaprine (FLEXERIL) 10 MG tablet  At bedtime PRN     07/19/17 1253        Wynona Luna, MD 07/21/17 [phone]

## 2017-07-28 ENCOUNTER — Ambulatory Visit: Payer: Medicare Other | Admitting: Internal Medicine

## 2017-08-03 ENCOUNTER — Ambulatory Visit: Payer: Self-pay | Admitting: Internal Medicine

## 2017-08-25 ENCOUNTER — Other Ambulatory Visit
Admission: RE | Admit: 2017-08-25 | Discharge: 2017-08-25 | Disposition: A | Discharge status: Home / Self Care | Payer: BC Managed Care – PPO | Source: Ambulatory Visit | Attending: Internal Medicine | Admitting: Internal Medicine

## 2017-08-25 ENCOUNTER — Other Ambulatory Visit: Payer: Self-pay | Admitting: Internal Medicine

## 2017-08-25 ENCOUNTER — Ambulatory Visit: Payer: BC Managed Care – PPO | Admitting: Internal Medicine

## 2017-08-25 ENCOUNTER — Encounter: Payer: Self-pay | Admitting: Internal Medicine

## 2017-08-25 VITALS — BP 134/86 | HR 90 | Ht 63.0 in | Wt 155.0 lb

## 2017-08-25 DIAGNOSIS — I10 Essential (primary) hypertension: Secondary | ICD-10-CM | POA: Insufficient documentation

## 2017-08-25 DIAGNOSIS — E1169 Type 2 diabetes mellitus with other specified complication: Secondary | ICD-10-CM | POA: Insufficient documentation

## 2017-08-25 DIAGNOSIS — E1165 Type 2 diabetes mellitus with hyperglycemia: Secondary | ICD-10-CM | POA: Diagnosis not present

## 2017-08-25 DIAGNOSIS — E785 Hyperlipidemia, unspecified: Secondary | ICD-10-CM

## 2017-08-25 DIAGNOSIS — IMO0001 Reserved for inherently not codable concepts without codable children: Secondary | ICD-10-CM

## 2017-08-25 DIAGNOSIS — R3129 Other microscopic hematuria: Secondary | ICD-10-CM | POA: Diagnosis not present

## 2017-08-25 DIAGNOSIS — M25551 Pain in right hip: Secondary | ICD-10-CM

## 2017-08-25 LAB — COMPREHENSIVE METABOLIC PANEL
ALT: 16 U/L — AB (ref 17–63)
AST: 21 U/L (ref 15–41)
Albumin: 4.3 g/dL (ref 3.5–5.0)
Alkaline Phosphatase: 111 U/L (ref 38–126)
Anion gap: 8 (ref 5–15)
BUN: 19 mg/dL (ref 6–20)
CHLORIDE: 101 mmol/L (ref 101–111)
CO2: 24 mmol/L (ref 22–32)
CREATININE: 0.77 mg/dL (ref 0.61–1.24)
Calcium: 9.4 mg/dL (ref 8.9–10.3)
GFR calc Af Amer: >60 mL/min (ref >60)
Glucose, Bld: 192 mg/dL — ABNORMAL HIGH (ref 65–99)
Potassium: 4.6 mmol/L (ref 3.5–5.1)
SODIUM: 133 mmol/L — AB (ref 135–145)
Total Bilirubin: 0.8 mg/dL (ref 0.3–1.2)
Total Protein: 7.8 g/dL (ref 6.5–8.1)

## 2017-08-25 LAB — LIPID PANEL
CHOL/HDL RATIO: 2.8 ratio
Cholesterol: 102 mg/dL (ref 0–200)
HDL: 37 mg/dL — AB (ref >40)
LDL CALC: 32 mg/dL (ref 0–99)
Triglycerides: 165 mg/dL — ABNORMAL HIGH (ref <150)
VLDL: 33 mg/dL (ref 0–40)

## 2017-08-25 LAB — POCT URINALYSIS DIPSTICK
Bilirubin, UA: NEGATIVE
Glucose, UA: 500
KETONES UA: NEGATIVE
Leukocytes, UA: NEGATIVE
NITRITE UA: NEGATIVE
PROTEIN UA: NEGATIVE
Spec Grav, UA: 1.02 (ref 1.010–1.025)
Urobilinogen, UA: 0.2 E.U./dL
pH, UA: 6 (ref 5.0–8.0)

## 2017-08-25 LAB — HEMOGLOBIN A1C
Hgb A1c MFr Bld: 9.3 % — ABNORMAL HIGH (ref 4.8–5.6)
Mean Plasma Glucose: 220.21 mg/dL

## 2017-08-25 MED ORDER — DULAGLUTIDE 1.5 MG/0.5ML ~~LOC~~ SOAJ: 1.5000 mg | SUBCUTANEOUS | 4 refills | Status: DC

## 2017-08-25 NOTE — Patient Instructions (Addendum)
Stop Pioglitazone (Actos) because there is blood in your urine.  Will check urine next visit.  Take Trulicity once a week on Wednesday.

## 2017-08-25 NOTE — Progress Notes (Signed)
Date:  08/25/2017   Name:  Daniel Bell   DOB:  1961-02-22   MRN:  [id number]   Chief Complaint: Diabetes Diabetes  He presents for his follow-up diabetic visit. He has type 2 diabetes mellitus. His disease course has been worsening. Pertinent negatives for hypoglycemia include no headaches or tremors. Pertinent negatives for diabetes include no chest pain, no fatigue, no polydipsia and no polyuria. Current diabetic treatments: 4 oral agents. He is compliant with treatment most of the time (usually skips meds on weekends). There is no compliance with monitoring of blood glucose. An ACE inhibitor/angiotensin II receptor blocker is being taken.  Hypertension  This is a chronic problem. The problem is controlled. Pertinent negatives include no chest pain, headaches, palpitations or shortness of breath. Past treatments include ACE inhibitors and calcium channel blockers. The current treatment provides significant improvement.  Hyperlipidemia  This is a chronic problem. The problem is controlled. Pertinent negatives include no chest pain or shortness of breath. Current antihyperlipidemic treatment includes statins. The current treatment provides significant improvement of lipids.  Hip Pain   There was no injury mechanism. The pain is present in the right hip. The quality of the pain is described as cramping and aching. The pain is mild. The pain has been fluctuating since onset. Pertinent negatives include no numbness. Exacerbated by: sitting. He has tried acetaminophen for the symptoms. The treatment provided moderate relief.   He quit smoking completely 2 weeks ago.  He is doing well and has no intention of starting back.  Lab Results  Component Value Date   HGBA1C 9.0 (H) 03/23/2017   Lab Results  Component Value Date   CREATININE 0.93 03/23/2017   BUN 17 03/23/2017   NA 134 (L) 03/23/2017   K 4.8 03/23/2017   CL 99 (L) 03/23/2017   CO2 27 03/23/2017   Lab Results  Component Value Date     CHOL 130 10/01/2016   HDL 41 10/01/2016   LDLCALC 66 10/01/2016   TRIG 116 10/01/2016   CHOLHDL 3.2 10/01/2016      Review of Systems  Constitutional: Negative for appetite change, fatigue and unexpected weight change.  Eyes: Negative for visual disturbance.  Respiratory: Negative for cough, shortness of breath and wheezing.   Cardiovascular: Negative for chest pain, palpitations and leg swelling.  Gastrointestinal: Negative for abdominal pain and blood in stool.  Endocrine: Negative for polydipsia and polyuria.  Genitourinary: Negative for dysuria and hematuria.  Musculoskeletal: Positive for neck stiffness. Negative for arthralgias and gait problem.  Skin: Negative for color change and rash.  Neurological: Negative for tremors, numbness and headaches.  Psychiatric/Behavioral: Negative for dysphoric mood and sleep disturbance.    Patient Active Problem List   Diagnosis Date Noted  . Cervical stenosis of spinal canal 12/10/2015  . Neural foraminal stenosis of cervical spine 12/10/2015  . Cervical radiculopathy 11/22/2015  . Diabetes mellitus type 2, uncontrolled, without complications ([place]) 68/05/7516  . Essential (primary) hypertension 11/22/2014  . Glaucoma 11/22/2014  . Bilateral hearing loss 11/22/2014  . Hyperlipidemia associated with type 2 diabetes mellitus ([place]) 11/22/2014    Prior to Admission medications   Medication Sig Start Date End Date Taking? Authorizing Provider  ACCU-CHEK FASTCLIX LANCETS MISC 2 (two) times daily. for testing 08/31/14   [provider]  ACCU-CHEK SMARTVIEW test strip 1 each by Other route 2 (two) times daily as needed. 11/22/14   Plonk, Gwyndolyn Saxon, MD  amLODipine-benazepril (LOTREL) 5-20 MG capsule Take 1 capsule by mouth daily.  03/23/17   Glean Hess, MD  aspirin 81 MG tablet Take 1 tablet (81 mg total) by mouth daily. 06/17/15   Plonk, Gwyndolyn Saxon, MD  Blood Glucose Monitoring Suppl (ACCU-CHEK NANO SMARTVIEW) W/DEVICE KIT  08/30/14    [provider]  Coenzyme Q10 (MEGA COQ10) 400 MG CAPS Take 1 capsule by mouth daily.    [provider]  glipiZIDE (GLUCOTROL) 5 MG tablet Take 1 tablet (5 mg total) by mouth 2 (two) times daily before a meal. 03/23/17   Glean Hess, MD  metFORMIN (GLUCOPHAGE) 1000 MG tablet Take 1 tablet (1,000 mg total) by mouth 2 (two) times daily. 03/23/17   Glean Hess, MD  Multiple Vitamins-Minerals (MULTIVITAMIN WITH MINERALS) tablet Take 1 tablet by mouth daily.    [provider]  naproxen (NAPROSYN) 500 MG tablet Take 1 tablet (500 mg total) by mouth 2 (two) times daily. 07/19/17   Wynona Luna, MD  pioglitazone (ACTOS) 30 MG tablet Take 1 tablet (30 mg total) by mouth daily. 03/23/17   Glean Hess, MD  simvastatin (ZOCOR) 40 MG tablet Take 1 tablet (40 mg total) by mouth daily. 03/23/17   Glean Hess, MD  sitaGLIPtin (JANUVIA) 100 MG tablet Take 1 tablet (100 mg total) by mouth daily. 03/23/17   Glean Hess, MD    No Known Allergies  History reviewed. No pertinent surgical history.  Social History   Tobacco Use  . Smoking status: Former Smoker    Types: Cigars    Last attempt to quit: 08/11/2017    Years since quitting: 0.0  . Smokeless tobacco: Never Used  . Tobacco comment: 2-3 cigars a day  Substance Use Topics  . Alcohol use: No    Alcohol/week: 0.0 oz  . Drug use: No     Medication list has been reviewed and updated.  PHQ 2/9 Scores 03/23/2017 10/16/2015 06/17/2015  PHQ - 2 Score 0 0 0    Physical Exam  Constitutional: He is oriented to person, place, and time. He appears well-developed. No distress.  HENT:  Head: Normocephalic and atraumatic.  Neck: Normal range of motion. Neck supple. No thyromegaly present.  Cardiovascular: Normal rate, regular rhythm and normal heart sounds.  Pulmonary/Chest: Effort normal and breath sounds normal. No respiratory distress. He has no wheezes.  Musculoskeletal: He exhibits  tenderness. He exhibits no edema.       Right hip: He exhibits decreased range of motion and tenderness.       Left hip: He exhibits decreased range of motion.  Neurological: He is alert and oriented to person, place, and time.  Skin: Skin is warm and dry. No rash noted.  Psychiatric: He has a normal mood and affect. His behavior is normal. Thought content normal.  Nursing note and vitals reviewed.   BP 134/86   Pulse 90   Ht 5' 3"  (1.6 m)   Wt 155 lb (70.3 kg)   SpO2 100%   BMI 27.46 kg/m   Assessment and Plan: 1. Diabetes mellitus type 2, uncontrolled, without complications ([place]) Stop Actos Begin Trulicity - first injection given today - Hemoglobin A1c - Microalbumin / creatinine urine ratio - POCT urinalysis dipstick - Dulaglutide (TRULICITY) 1.5 DE/0.8XK SOPN; Inject 1.5 mg into the skin once a week.  Dispense: 4 pen; Refill: 4  2. Essential (primary) hypertension controlled - Comprehensive metabolic panel  3. Hyperlipidemia associated with type 2 diabetes mellitus ([place]) On statin therapy - Lipid panel  4. Hematuria, microscopic Will  recheck next visit If persistent, will refer to Urology  5. Pain of right hip joint Continue tylenol Likely OA from childhood surgery   Meds ordered this encounter  Medications  . Dulaglutide (TRULICITY) 1.5 SP/9.2CF SOPN    Sig: Inject 1.5 mg into the skin once a week.    Dispense:  4 pen    Refill:  4    Partially dictated using Editor, commissioning. Any errors are unintentional.  [name], MD [facility]  08/25/2017

## 2017-08-28 LAB — SPECIMEN STATUS REPORT

## 2017-08-28 LAB — MICROALBUMIN / CREATININE URINE RATIO
CREATININE, UR: 94.8 mg/dL
MICROALB/CREAT RATIO: 14.5 mg/g{creat} (ref 0.0–30.0)
Microalbumin, Urine: 13.7 ug/mL

## 2017-11-23 LAB — HM DIABETES EYE EXAM

## 2017-11-25 ENCOUNTER — Encounter: Payer: Self-pay | Admitting: Internal Medicine

## 2018-01-10 ENCOUNTER — Ambulatory Visit (INDEPENDENT_AMBULATORY_CARE_PROVIDER_SITE_OTHER): Payer: BC Managed Care – PPO | Admitting: Internal Medicine

## 2018-01-10 ENCOUNTER — Encounter: Payer: Self-pay | Admitting: Internal Medicine

## 2018-01-10 ENCOUNTER — Other Ambulatory Visit
Admission: RE | Admit: 2018-01-10 | Discharge: 2018-01-10 | Disposition: A | Discharge status: Home / Self Care | Payer: BC Managed Care – PPO | Source: Ambulatory Visit | Attending: Internal Medicine | Admitting: Internal Medicine

## 2018-01-10 VITALS — BP 132/78 | HR 82 | Ht 63.0 in | Wt 143.4 lb

## 2018-01-10 DIAGNOSIS — L723 Sebaceous cyst: Secondary | ICD-10-CM

## 2018-01-10 DIAGNOSIS — F17290 Nicotine dependence, other tobacco product, uncomplicated: Secondary | ICD-10-CM | POA: Diagnosis not present

## 2018-01-10 DIAGNOSIS — E119 Type 2 diabetes mellitus without complications: Secondary | ICD-10-CM | POA: Diagnosis present

## 2018-01-10 DIAGNOSIS — IMO0001 Reserved for inherently not codable concepts without codable children: Secondary | ICD-10-CM

## 2018-01-10 DIAGNOSIS — E785 Hyperlipidemia, unspecified: Secondary | ICD-10-CM | POA: Insufficient documentation

## 2018-01-10 DIAGNOSIS — E1169 Type 2 diabetes mellitus with other specified complication: Secondary | ICD-10-CM

## 2018-01-10 DIAGNOSIS — E1165 Type 2 diabetes mellitus with hyperglycemia: Secondary | ICD-10-CM | POA: Diagnosis not present

## 2018-01-10 DIAGNOSIS — I10 Essential (primary) hypertension: Secondary | ICD-10-CM

## 2018-01-10 LAB — CBC WITH DIFFERENTIAL/PLATELET
BASOS ABS: 0.1 10*3/uL (ref 0–0.1)
Basophils Relative: 1 %
EOS ABS: 0.3 10*3/uL (ref 0–0.7)
Eosinophils Relative: 3 %
HCT: 44.9 % (ref 40.0–52.0)
Hemoglobin: 15.7 g/dL (ref 13.0–18.0)
Lymphocytes Relative: 34 %
Lymphs Abs: 3 10*3/uL (ref 1.0–3.6)
MCH: 31 pg (ref 26.0–34.0)
MCHC: 35 g/dL (ref 32.0–36.0)
MCV: 88.6 fL (ref 80.0–100.0)
MONO ABS: 0.7 10*3/uL (ref 0.2–1.0)
Monocytes Relative: 8 %
Neutro Abs: 4.8 10*3/uL (ref 1.4–6.5)
Neutrophils Relative %: 54 %
Platelets: 230 10*3/uL (ref 150–440)
RBC: 5.07 MIL/uL (ref 4.40–5.90)
RDW: 13.2 % (ref 11.5–14.5)
WBC: 8.8 10*3/uL (ref 3.8–10.6)

## 2018-01-10 LAB — BASIC METABOLIC PANEL
ANION GAP: 11 (ref 5–15)
BUN: 16 mg/dL (ref 6–20)
CHLORIDE: 104 mmol/L (ref 98–111)
CO2: 23 mmol/L (ref 22–32)
CREATININE: 0.91 mg/dL (ref 0.61–1.24)
Calcium: 9.3 mg/dL (ref 8.9–10.3)
GFR calc non Af Amer: >60 mL/min (ref >60)
GLUCOSE: 162 mg/dL — AB (ref 70–99)
Potassium: 4.3 mmol/L (ref 3.5–5.1)
Sodium: 138 mmol/L (ref 135–145)

## 2018-01-10 MED ORDER — DULAGLUTIDE 1.5 MG/0.5ML ~~LOC~~ SOAJ: 1.5000 mg | SUBCUTANEOUS | 5 refills | Status: DC

## 2018-01-10 NOTE — Progress Notes (Signed)
Date:  01/10/2018   Name:  Daniel Bell   DOB:  1960/09/26   MRN:  [id number]   Chief Complaint: Diabetes (4 month f/up.) and Mass (Started 3 weeks ago- still not going away. Uses hot compressed in the shower and no help. Painful at times. No blood or pus coming out.) Diabetes  He presents for his follow-up diabetic visit. He has type 2 diabetes mellitus. His disease course has been improving. Pertinent negatives for hypoglycemia include no headaches or tremors. Pertinent negatives for diabetes include no chest pain, no fatigue, no polydipsia and no polyuria. Current diabetic treatments: januvia, metformin, glipizide and Trulicity started last visit. He is compliant with treatment all of the time. There is no compliance with monitoring of blood glucose. An ACE inhibitor/angiotensin II receptor blocker is being taken.  Hypertension  This is a chronic problem. The problem is controlled. Pertinent negatives include no chest pain, headaches, palpitations or shortness of breath. Past treatments include ACE inhibitors and calcium channel blockers. The current treatment provides significant improvement.  Hyperlipidemia  Pertinent negatives include no chest pain or shortness of breath. Current antihyperlipidemic treatment includes statins.  Axillary mass - in right axilla.  Started about 3 weeks ago.  Never drained but slightly tender.  Using warm compresses and it is getting smaller.  Lab Results  Component Value Date   HGBA1C 9.3 (H) 08/25/2017   Lab Results  Component Value Date   CHOL 102 08/25/2017   HDL 37 (L) 08/25/2017   LDLCALC 32 08/25/2017   TRIG 165 (H) 08/25/2017   CHOLHDL 2.8 08/25/2017   Lab Results  Component Value Date   CREATININE 0.77 08/25/2017   BUN 19 08/25/2017   NA 133 (L) 08/25/2017   K 4.6 08/25/2017   CL 101 08/25/2017   CO2 24 08/25/2017      Review of Systems  Constitutional: Positive for unexpected weight change (12 lbs due to diet change and decreased  appetite). Negative for appetite change and fatigue.  Eyes: Negative for visual disturbance.  Respiratory: Negative for cough, shortness of breath and wheezing.   Cardiovascular: Negative for chest pain, palpitations and leg swelling.  Gastrointestinal: Negative for abdominal pain and blood in stool.  Endocrine: Negative for polydipsia and polyuria.  Genitourinary: Negative for dysuria and hematuria.  Skin: Negative for color change and rash.       Lump in right axilla   Neurological: Negative for tremors, numbness and headaches.  Hematological: Negative for adenopathy.  Psychiatric/Behavioral: Negative for dysphoric mood.    Patient Active Problem List   Diagnosis Date Noted  . Pain of right hip joint 08/25/2017  . Hematuria, microscopic 08/25/2017  . Cervical stenosis of spinal canal 12/10/2015  . Neural foraminal stenosis of cervical spine 12/10/2015  . Cervical radiculopathy 11/22/2015  . Diabetes mellitus type 2, uncontrolled, without complications ([place]) 54/27/0623  . Essential (primary) hypertension 11/22/2014  . Glaucoma 11/22/2014  . Bilateral hearing loss 11/22/2014  . Hyperlipidemia associated with type 2 diabetes mellitus ([place]) 11/22/2014    No Known Allergies  Past Surgical History:  Procedure Laterality Date  . HIP SURGERY Right    as a baby for leg length discrepancy    Social History   Tobacco Use  . Smoking status: Current Every Day Smoker    Types: Cigars    Last attempt to quit: 08/11/2017    Years since quitting: 0.4  . Smokeless tobacco: Never Used  . Tobacco comment: 3-5 cigars  Substance Use Topics  .  Alcohol use: No    Alcohol/week: 0.0 standard drinks  . Drug use: No     Medication list has been reviewed and updated.  Current Meds  Medication Sig  . ACCU-CHEK FASTCLIX LANCETS MISC 2 (two) times daily. for testing  . ACCU-CHEK SMARTVIEW test strip 1 each by Other route 2 (two) times daily as needed.  Marland Kitchen amLODipine-benazepril (LOTREL)  5-20 MG capsule Take 1 capsule by mouth daily.  Marland Kitchen aspirin 81 MG tablet Take 1 tablet (81 mg total) by mouth daily.  . Blood Glucose Monitoring Suppl (ACCU-CHEK NANO SMARTVIEW) W/DEVICE KIT   . Dulaglutide (TRULICITY) 1.5 WO/0.3OZ SOPN Inject 1.5 mg into the skin once a week.  Marland Kitchen glipiZIDE (GLUCOTROL) 5 MG tablet Take 1 tablet (5 mg total) by mouth 2 (two) times daily before a meal.  . metFORMIN (GLUCOPHAGE) 1000 MG tablet Take 1 tablet (1,000 mg total) by mouth 2 (two) times daily.  . Multiple Vitamins-Minerals (MULTIVITAMIN WITH MINERALS) tablet Take 1 tablet by mouth daily.  . naproxen (NAPROSYN) 500 MG tablet Take 1 tablet (500 mg total) by mouth 2 (two) times daily.  . simvastatin (ZOCOR) 40 MG tablet Take 1 tablet (40 mg total) by mouth daily.  . sitaGLIPtin (JANUVIA) 100 MG tablet Take 1 tablet (100 mg total) by mouth daily.    PHQ 2/9 Scores 03/23/2017 10/16/2015 06/17/2015  PHQ - 2 Score 0 0 0    Physical Exam  Constitutional: He is oriented to person, place, and time. He appears well-developed. No distress.  HENT:  Head: Normocephalic and atraumatic.  Neck: Normal range of motion. Neck supple.  Cardiovascular: Normal rate, regular rhythm and normal heart sounds.  Pulmonary/Chest: Effort normal and breath sounds normal. No respiratory distress.  Musculoskeletal: Normal range of motion.  Neurological: He is alert and oriented to person, place, and time.  Skin: Skin is warm and dry. No rash noted.  Non tender cystic mass 1 cm in right axilla.  No redness, drainage or odor  Psychiatric: He has a normal mood and affect. His behavior is normal. Thought content normal.  Nursing note and vitals reviewed.   BP 132/78   Pulse 82   Ht 5' 3"  (1.6 m)   Wt 143 lb 6.4 oz (65 kg)   SpO2 100%   BMI 25.40 kg/m   Assessment and Plan: 1. Diabetes mellitus type 2, uncontrolled, without complications ([place]) Hopefully improved with change in therapy, weight loss - Basic metabolic panel -  Hemoglobin A1c - Dulaglutide (TRULICITY) 1.5 YY/4.8GN SOPN; Inject 1.5 mg into the skin once a week.  Dispense: 4 pen; Refill: 5  2. Essential (primary) hypertension controlled - CBC with Differential/Platelet  3. Hyperlipidemia associated with type 2 diabetes mellitus ([place]) On statin therapy  4. Sebaceous cyst of right axilla Continue compresses Return if worsening   Meds ordered this encounter  Medications  . Dulaglutide (TRULICITY) 1.5 OI/3.7CW SOPN    Sig: Inject 1.5 mg into the skin once a week.    Dispense:  4 pen    Refill:  5    Partially dictated using Editor, commissioning. Any errors are unintentional.  [name], MD [facility]  01/10/2018

## 2018-01-11 LAB — HEMOGLOBIN A1C
HEMOGLOBIN A1C: 6.3 % — AB (ref 4.8–5.6)
Mean Plasma Glucose: 134 mg/dL

## 2018-02-17 ENCOUNTER — Other Ambulatory Visit: Payer: Self-pay | Admitting: Internal Medicine

## 2018-06-08 ENCOUNTER — Encounter: Payer: Self-pay | Admitting: Internal Medicine

## 2018-06-08 ENCOUNTER — Ambulatory Visit (INDEPENDENT_AMBULATORY_CARE_PROVIDER_SITE_OTHER): Payer: BC Managed Care – PPO | Admitting: Internal Medicine

## 2018-06-08 VITALS — BP 128/78 | HR 75 | Ht 63.0 in | Wt 144.4 lb

## 2018-06-08 DIAGNOSIS — E1165 Type 2 diabetes mellitus with hyperglycemia: Secondary | ICD-10-CM

## 2018-06-08 DIAGNOSIS — E1169 Type 2 diabetes mellitus with other specified complication: Secondary | ICD-10-CM | POA: Diagnosis not present

## 2018-06-08 DIAGNOSIS — Z125 Encounter for screening for malignant neoplasm of prostate: Secondary | ICD-10-CM | POA: Diagnosis not present

## 2018-06-08 DIAGNOSIS — E785 Hyperlipidemia, unspecified: Secondary | ICD-10-CM

## 2018-06-08 DIAGNOSIS — M4802 Spinal stenosis, cervical region: Secondary | ICD-10-CM

## 2018-06-08 DIAGNOSIS — M9981 Other biomechanical lesions of cervical region: Secondary | ICD-10-CM | POA: Diagnosis not present

## 2018-06-08 DIAGNOSIS — I10 Essential (primary) hypertension: Secondary | ICD-10-CM

## 2018-06-08 DIAGNOSIS — Z1211 Encounter for screening for malignant neoplasm of colon: Secondary | ICD-10-CM

## 2018-06-08 DIAGNOSIS — Z0000 Encounter for general adult medical examination without abnormal findings: Secondary | ICD-10-CM

## 2018-06-08 DIAGNOSIS — M25551 Pain in right hip: Secondary | ICD-10-CM

## 2018-06-08 DIAGNOSIS — IMO0001 Reserved for inherently not codable concepts without codable children: Secondary | ICD-10-CM

## 2018-06-08 DIAGNOSIS — Z23 Encounter for immunization: Secondary | ICD-10-CM | POA: Diagnosis not present

## 2018-06-08 LAB — POCT URINALYSIS DIPSTICK
Bilirubin, UA: NEGATIVE
Blood, UA: NEGATIVE
Glucose, UA: NEGATIVE
Ketones, UA: NEGATIVE
Leukocytes, UA: NEGATIVE
Nitrite, UA: NEGATIVE
Protein, UA: NEGATIVE
Spec Grav, UA: 1.015
Urobilinogen, UA: 0.2 U/dL
pH, UA: 6

## 2018-06-08 MED ORDER — CYCLOBENZAPRINE HCL 10 MG PO TABS: 10.0000 mg | ORAL_TABLET | Freq: Three times a day (TID) | ORAL | 2 refills | Status: DC | PRN

## 2018-06-08 MED ORDER — MELOXICAM 15 MG PO TABS: 15.0000 mg | ORAL_TABLET | Freq: Every day | ORAL | 2 refills | Status: DC | PRN

## 2018-06-08 MED ORDER — DULAGLUTIDE 1.5 MG/0.5ML ~~LOC~~ SOAJ: 1.5000 mg | SUBCUTANEOUS | 5 refills | Status: DC

## 2018-06-08 NOTE — Progress Notes (Signed)
Date:  06/08/2018   Name:  Daniel Bell   DOB:  [birth year]   MRN:  [id number]   Chief Complaint: Annual Exam (flu shot) and Diabetes (DM was worse last lab check. Needs recheck.) Daniel Bell is a 58 y.o. male who presents today for his Complete Annual Exam. He feels fairly well. He reports exercising walking. He reports he is sleeping well. He is due for his flu vaccine.  He is also due for colonoscopy - [place].  Diabetes  He presents for his follow-up diabetic visit. He has type 2 diabetes mellitus. His disease course has been stable. There are no hypoglycemic associated symptoms. Pertinent negatives for hypoglycemia include no dizziness or headaches. Pertinent negatives for diabetes include no chest pain and no fatigue. Symptoms are stable. Current diabetic treatment includes oral agent (triple therapy) (and trulicity). He is compliant with treatment all of the time. An ACE inhibitor/angiotensin II receptor blocker is being taken.  Hypertension  This is a chronic problem. The problem is unchanged. Pertinent negatives include no chest pain, headaches, palpitations or shortness of breath.  Hyperlipidemia  This is a chronic problem. The problem is controlled. Associated symptoms include myalgias. Pertinent negatives include no chest pain or shortness of breath. Current antihyperlipidemic treatment includes statins. The current treatment provides significant improvement of lipids.  Hip Pain   There was no injury mechanism. The pain is present in the right hip. He has tried NSAIDs for the symptoms.   Lab Results  Component Value Date   HGBA1C 6.3 (H) 01/10/2018     Review of Systems  Constitutional: Negative for appetite change, chills, diaphoresis, fatigue and unexpected weight change.  HENT: Negative for hearing loss, tinnitus, trouble swallowing and voice change.   Eyes: Negative for visual disturbance.  Respiratory: Negative for choking,  shortness of breath and wheezing.   Cardiovascular: Negative for chest pain, palpitations and leg swelling.  Gastrointestinal: Negative for abdominal pain, blood in stool, constipation and diarrhea.  Genitourinary: Negative for difficulty urinating, dysuria and frequency.  Musculoskeletal: Positive for arthralgias and myalgias. Negative for back pain.  Skin: Negative for color change and rash.  Allergic/Immunologic: Negative for environmental allergies.  Neurological: Negative for dizziness, syncope and headaches.  Hematological: Negative for adenopathy.  Psychiatric/Behavioral: Negative for dysphoric mood and sleep disturbance.    Patient Active Problem List   Diagnosis Date Noted  . Pain of right hip joint 08/25/2017  . Hematuria, microscopic 08/25/2017  . Cervical stenosis of spinal canal 12/10/2015  . Neural foraminal stenosis of cervical spine 12/10/2015  . Cervical radiculopathy 11/22/2015  . Diabetes mellitus type 2, uncontrolled, without complications ([place]) 15/83/0940  . Essential (primary) hypertension 11/22/2014  . Glaucoma 11/22/2014  . Bilateral hearing loss 11/22/2014  . Hyperlipidemia associated with type 2 diabetes mellitus ([place]) 11/22/2014    No Known Allergies  Past Surgical History:  Procedure Laterality Date  . HIP SURGERY Right    as a baby for leg length discrepancy    Social History   Tobacco Use  . Smoking status: Current Every Day Smoker    Types: Cigars    Last attempt to quit: 08/11/2017    Years since quitting: 0.8  . Smokeless tobacco: Never Used  . Tobacco comment: 3-5 cigars  Substance Use Topics  . Alcohol use: No    Alcohol/week: 0.0 standard drinks  . Drug use: No     Medication list has been reviewed and updated.  Current  Meds  Medication Sig  . ACCU-CHEK FASTCLIX LANCETS MISC 2 (two) times daily. for testing  . ACCU-CHEK SMARTVIEW test strip 1 each by Other route 2 (two) times daily as needed.  Marland Kitchen amLODipine-benazepril (LOTREL)  5-20 MG capsule TAKE 1 CAPSULE BY MOUTH EVERY DAY  . aspirin 81 MG tablet Take 1 tablet (81 mg total) by mouth daily.  . Blood Glucose Monitoring Suppl (ACCU-CHEK NANO SMARTVIEW) W/DEVICE KIT   . cyclobenzaprine (FLEXERIL) 10 MG tablet Take 10 mg by mouth 3 (three) times daily as needed for muscle spasms.  . Dulaglutide (TRULICITY) 1.5 ME/1.5AX SOPN Inject 1.5 mg into the skin once a week.  Marland Kitchen glipiZIDE (GLUCOTROL) 5 MG tablet TAKE 1 TABLET (5 MG TOTAL) BY MOUTH 2 (TWO) TIMES DAILY BEFORE A MEAL.  . meloxicam (MOBIC) 15 MG tablet Take 15 mg by mouth daily as needed for pain.  . metFORMIN (GLUCOPHAGE) 1000 MG tablet TAKE 1 TABLET BY MOUTH TWICE A DAY  . Multiple Vitamins-Minerals (MULTIVITAMIN WITH MINERALS) tablet Take 1 tablet by mouth daily.  . naproxen (NAPROSYN) 500 MG tablet Take 1 tablet (500 mg total) by mouth 2 (two) times daily.  . simvastatin (ZOCOR) 40 MG tablet TAKE 1 TABLET BY MOUTH EVERY DAY  . sitaGLIPtin (JANUVIA) 100 MG tablet Take 1 tablet (100 mg total) by mouth daily.    PHQ 2/9 Scores 03/23/2017 10/16/2015 06/17/2015  PHQ - 2 Score 0 0 0    Physical Exam Vitals signs and nursing note reviewed.  Constitutional:      Appearance: Normal appearance. He is well-developed.  HENT:     Head: Normocephalic.     Right Ear: Tympanic membrane, ear canal and external ear normal.     Left Ear: Tympanic membrane, ear canal and external ear normal.     Nose: Nose normal.     Mouth/Throat:     Pharynx: Uvula midline.  Eyes:     Conjunctiva/sclera: Conjunctivae normal.     Pupils: Pupils are equal, round, and reactive to light.  Neck:     Musculoskeletal: Normal range of motion and neck supple.     Thyroid: No thyromegaly.     Vascular: No carotid bruit.  Cardiovascular:     Rate and Rhythm: Normal rate and regular rhythm.     Heart sounds: Normal heart sounds.  Pulmonary:     Effort: Pulmonary effort is normal.     Breath sounds: Normal breath sounds. No wheezing.  Chest:       Breasts:        Right: No mass.        Left: No mass.  Abdominal:     General: Bowel sounds are normal.     Palpations: Abdomen is soft.     Tenderness: There is no abdominal tenderness.  Musculoskeletal:     Right hip: He exhibits decreased range of motion and tenderness.     Left hip: He exhibits decreased range of motion. He exhibits no tenderness.  Lymphadenopathy:     Cervical: No cervical adenopathy.  Skin:    General: Skin is warm and dry.  Neurological:     Mental Status: He is alert and oriented to person, place, and time.     Cranial Nerves: Cranial nerves are intact.     Sensory: Sensation is intact.     Gait: Gait abnormal.     Deep Tendon Reflexes: Reflexes are normal and symmetric.  Psychiatric:        Speech: Speech normal.  Behavior: Behavior normal.        Thought Content: Thought content normal.        Judgment: Judgment normal.    Wt Readings from Last 3 Encounters:  06/08/18 144 lb 6.4 oz (65.5 kg)  01/10/18 143 lb 6.4 oz (65 kg)  08/25/17 155 lb (70.3 kg)    BP 128/78 (BP Location: Right Arm, Patient Position: Sitting, Cuff Size: Normal)   Pulse 75   Ht 5' 3"  (1.6 m)   Wt 144 lb 6.4 oz (65.5 kg)   SpO2 99%   BMI 25.58 kg/m   Assessment and Plan: 1. Annual physical exam Normal exam - continue exercise - POCT Urinalysis Dipstick  2. Diabetes mellitus type 2, uncontrolled, without complications ([facility]) controlled - HgB A1c - Comprehensive Metabolic Panel (CMET) - Dulaglutide (TRULICITY) 1.5 UG/6.4GE SOPN; Inject 1.5 mg into the skin once a week.  Dispense: 4 pen; Refill: 5  3. Prostate cancer screening DRE deferred to lack of sx and family hx - PSA  4. Essential (primary) hypertension controlled - CBC w/Diff/Platelet  5. Hyperlipidemia associated with type 2 diabetes mellitus ([place]) On statin therapy - Lipid Profile  6. Neural foraminal stenosis of cervical spine stable  7. Pain of right hip joint - meloxicam (MOBIC) 15 MG  tablet; Take 1 tablet (15 mg total) by mouth daily as needed for pain.  Dispense: 30 tablet; Refill: 2 - cyclobenzaprine (FLEXERIL) 10 MG tablet; Take 1 tablet (10 mg total) by mouth 3 (three) times daily as needed for muscle spasms.  Dispense: 90 tablet; Refill: 2  8. Colon cancer screening Due for 10 yr exam - Ambulatory referral to Gastroenterology  9. Need for immunization against influenza - Flu Vaccine QUAD 36+ mos IM   Partially dictated using Editor, commissioning. Any errors are unintentional.  [name], MD [facility]  06/08/2018

## 2018-06-09 ENCOUNTER — Other Ambulatory Visit
Admission: RE | Admit: 2018-06-09 | Discharge: 2018-06-09 | Disposition: A | Discharge status: Home / Self Care | Payer: BC Managed Care – PPO | Attending: Internal Medicine | Admitting: Internal Medicine

## 2018-06-09 DIAGNOSIS — Z125 Encounter for screening for malignant neoplasm of prostate: Secondary | ICD-10-CM | POA: Insufficient documentation

## 2018-06-09 DIAGNOSIS — E119 Type 2 diabetes mellitus without complications: Secondary | ICD-10-CM | POA: Diagnosis not present

## 2018-06-09 DIAGNOSIS — I10 Essential (primary) hypertension: Secondary | ICD-10-CM | POA: Diagnosis present

## 2018-06-09 LAB — LIPID PANEL
CHOL/HDL RATIO: 3.5 ratio
CHOLESTEROL: 123 mg/dL (ref 0–200)
HDL: 35 mg/dL — ABNORMAL LOW (ref >40)
LDL Cholesterol: 60 mg/dL (ref 0–99)
Triglycerides: 142 mg/dL (ref <150)
VLDL: 28 mg/dL (ref 0–40)

## 2018-06-09 LAB — CBC WITH DIFFERENTIAL/PLATELET
Abs Immature Granulocytes: 0.02 10*3/uL (ref 0.00–0.07)
BASOS PCT: 1 %
Basophils Absolute: 0.1 10*3/uL (ref 0.0–0.1)
EOS PCT: 3 %
Eosinophils Absolute: 0.2 10*3/uL (ref 0.0–0.5)
HCT: 44.2 % (ref 39.0–52.0)
HEMOGLOBIN: 15.7 g/dL (ref 13.0–17.0)
Immature Granulocytes: 0 %
LYMPHS PCT: 20 %
Lymphs Abs: 1.5 10*3/uL (ref 0.7–4.0)
MCH: 31 pg (ref 26.0–34.0)
MCHC: 35.5 g/dL (ref 30.0–36.0)
MCV: 87.4 fL (ref 80.0–100.0)
MONO ABS: 0.6 10*3/uL (ref 0.1–1.0)
Monocytes Relative: 8 %
Neutro Abs: 4.9 10*3/uL (ref 1.7–7.7)
Neutrophils Relative %: 68 %
Platelets: 203 10*3/uL (ref 150–400)
RBC: 5.06 MIL/uL (ref 4.22–5.81)
RDW: 12.6 % (ref 11.5–15.5)
WBC: 7.2 10*3/uL (ref 4.0–10.5)
nRBC: 0 % (ref 0.0–0.2)

## 2018-06-09 LAB — COMPREHENSIVE METABOLIC PANEL
ALT: 12 U/L (ref 0–44)
AST: 17 U/L (ref 15–41)
Albumin: 4.5 g/dL (ref 3.5–5.0)
Alkaline Phosphatase: 107 U/L (ref 38–126)
Anion gap: 7 (ref 5–15)
BUN: 14 mg/dL (ref 6–20)
CHLORIDE: 101 mmol/L (ref 98–111)
CO2: 26 mmol/L (ref 22–32)
CREATININE: 0.84 mg/dL (ref 0.61–1.24)
Calcium: 9.5 mg/dL (ref 8.9–10.3)
Glucose, Bld: 154 mg/dL — ABNORMAL HIGH (ref 70–99)
POTASSIUM: 4.3 mmol/L (ref 3.5–5.1)
Sodium: 134 mmol/L — ABNORMAL LOW (ref 135–145)
Total Bilirubin: 1.1 mg/dL (ref 0.3–1.2)
Total Protein: 7.7 g/dL (ref 6.5–8.1)

## 2018-06-09 LAB — PSA: PROSTATIC SPECIFIC ANTIGEN: 1.35 ng/mL (ref 0.00–4.00)

## 2018-06-09 LAB — HEMOGLOBIN A1C
HEMOGLOBIN A1C: 6.9 % — AB (ref 4.8–5.6)
MEAN PLASMA GLUCOSE: 151.33 mg/dL

## 2018-06-10 ENCOUNTER — Telehealth: Payer: Self-pay

## 2018-06-10 ENCOUNTER — Other Ambulatory Visit: Payer: Self-pay

## 2018-06-10 DIAGNOSIS — Z1211 Encounter for screening for malignant neoplasm of colon: Secondary | ICD-10-CM

## 2018-06-10 NOTE — Telephone Encounter (Signed)
Pt left vm he is returning a call to schedule procedure

## 2018-06-10 NOTE — Telephone Encounter (Signed)
Call returned colonoscopy scheduled for 07/04/18 at [facility] with Dr. Marius Ditch.  Thanks [employer]

## 2018-07-01 ENCOUNTER — Encounter: Payer: Self-pay | Admitting: *Deleted

## 2018-07-04 ENCOUNTER — Encounter: Payer: Self-pay | Admitting: *Deleted

## 2018-07-04 ENCOUNTER — Encounter
Admission: RE | Disposition: A | Discharge status: Home / Self Care | Payer: Self-pay | Source: Home / Self Care | Attending: Gastroenterology

## 2018-07-04 ENCOUNTER — Ambulatory Visit: Payer: BC Managed Care – PPO | Admitting: Anesthesiology

## 2018-07-04 ENCOUNTER — Ambulatory Visit
Admission: RE | Admit: 2018-07-04 | Discharge: 2018-07-04 | Disposition: A | Discharge status: Home / Self Care | Payer: BC Managed Care – PPO | Attending: Gastroenterology | Admitting: Gastroenterology

## 2018-07-04 DIAGNOSIS — D120 Benign neoplasm of cecum: Secondary | ICD-10-CM | POA: Insufficient documentation

## 2018-07-04 DIAGNOSIS — D125 Benign neoplasm of sigmoid colon: Secondary | ICD-10-CM | POA: Insufficient documentation

## 2018-07-04 DIAGNOSIS — Z1211 Encounter for screening for malignant neoplasm of colon: Secondary | ICD-10-CM | POA: Diagnosis not present

## 2018-07-04 DIAGNOSIS — Z79899 Other long term (current) drug therapy: Secondary | ICD-10-CM | POA: Diagnosis not present

## 2018-07-04 DIAGNOSIS — Z7984 Long term (current) use of oral hypoglycemic drugs: Secondary | ICD-10-CM | POA: Diagnosis not present

## 2018-07-04 DIAGNOSIS — F17290 Nicotine dependence, other tobacco product, uncomplicated: Secondary | ICD-10-CM | POA: Diagnosis not present

## 2018-07-04 DIAGNOSIS — D124 Benign neoplasm of descending colon: Secondary | ICD-10-CM | POA: Insufficient documentation

## 2018-07-04 DIAGNOSIS — I10 Essential (primary) hypertension: Secondary | ICD-10-CM | POA: Diagnosis not present

## 2018-07-04 DIAGNOSIS — Z791 Long term (current) use of non-steroidal anti-inflammatories (NSAID): Secondary | ICD-10-CM | POA: Insufficient documentation

## 2018-07-04 DIAGNOSIS — E119 Type 2 diabetes mellitus without complications: Secondary | ICD-10-CM | POA: Diagnosis not present

## 2018-07-04 DIAGNOSIS — K644 Residual hemorrhoidal skin tags: Secondary | ICD-10-CM | POA: Diagnosis not present

## 2018-07-04 DIAGNOSIS — E785 Hyperlipidemia, unspecified: Secondary | ICD-10-CM | POA: Insufficient documentation

## 2018-07-04 DIAGNOSIS — D122 Benign neoplasm of ascending colon: Secondary | ICD-10-CM | POA: Insufficient documentation

## 2018-07-04 DIAGNOSIS — Z7982 Long term (current) use of aspirin: Secondary | ICD-10-CM | POA: Insufficient documentation

## 2018-07-04 HISTORY — PX: COLONOSCOPY WITH PROPOFOL: SHX5780

## 2018-07-04 LAB — GLUCOSE, CAPILLARY: Glucose-Capillary: 117 mg/dL — ABNORMAL HIGH (ref 70–99)

## 2018-07-04 SURGERY — COLONOSCOPY WITH PROPOFOL
Anesthesia: General

## 2018-07-04 MED ORDER — FENTANYL CITRATE (PF) 100 MCG/2ML IJ SOLN: 25.0000 ug | INTRAMUSCULAR | Status: DC | PRN

## 2018-07-04 MED ORDER — SODIUM CHLORIDE 0.9 % IV SOLN
INTRAVENOUS | Status: DC
  Administered 2018-07-04: 1000 mL via INTRAVENOUS

## 2018-07-04 MED ORDER — PROPOFOL 500 MG/50ML IV EMUL
INTRAVENOUS | Status: AC
  Filled 2018-07-04: qty 50

## 2018-07-04 MED ORDER — DEXAMETHASONE SODIUM PHOSPHATE 10 MG/ML IJ SOLN: INTRAMUSCULAR | Status: DC | PRN

## 2018-07-04 MED ORDER — ONDANSETRON HCL 4 MG/2ML IJ SOLN: 4.0000 mg | Freq: Once | INTRAMUSCULAR | Status: DC | PRN

## 2018-07-04 MED ORDER — PROPOFOL 10 MG/ML IV BOLUS: INTRAVENOUS | Status: DC | PRN

## 2018-07-04 MED ORDER — PROPOFOL 500 MG/50ML IV EMUL: INTRAVENOUS | Status: DC | PRN

## 2018-07-04 MED ORDER — PROPOFOL 500 MG/50ML IV EMUL
INTRAVENOUS | Status: DC | PRN
  Administered 2018-07-04: 140 ug/kg/min via INTRAVENOUS

## 2018-07-04 NOTE — Transfer of Care (Signed)
Immediate Anesthesia Transfer of Care Note  Patient: Daniel Bell  Procedure(s) Performed: COLONOSCOPY WITH PROPOFOL (N/A )  Patient Location: Endoscopy Unit  Anesthesia Type:General  Level of Consciousness: awake and alert   Airway & Oxygen Therapy: Patient Spontanous Breathing  Post-op Assessment: Report given to RN  Post vital signs: Reviewed  Last Vitals:  Vitals Value Taken Time  BP    Temp    Pulse 72 07/04/2018  9:22 AM  Resp 15 07/04/2018  9:22 AM  SpO2 100 % 07/04/2018  9:22 AM  Vitals shown include unvalidated device data.  Last Pain:  Vitals:   07/04/18 0707  TempSrc: Tympanic  PainSc: 0-No pain         Complications: No apparent anesthesia complications

## 2018-07-04 NOTE — Anesthesia Post-op Follow-up Note (Signed)
Anesthesia QCDR form completed.        

## 2018-07-04 NOTE — Anesthesia Preprocedure Evaluation (Signed)
Anesthesia Evaluation  Patient identified by MRN, date of birth, ID band Patient awake    Reviewed: Allergy & Precautions, NPO status , Patient's Chart, lab work & pertinent test results  Airway Mallampati: II  TM Distance: >3 FB     Dental   Pulmonary Current Smoker,    Pulmonary exam normal        Cardiovascular hypertension, Normal cardiovascular exam     Neuro/Psych  Neuromuscular disease negative psych ROS   GI/Hepatic negative GI ROS, Neg liver ROS,   Endo/Other  diabetes, Well Controlled, Type 2, Oral Hypoglycemic Agents  Renal/GU negative Renal ROS  negative genitourinary   Musculoskeletal  (+) Arthritis , Osteoarthritis,    Abdominal Normal abdominal exam  (+)   Peds negative pediatric ROS (+)  Hematology negative hematology ROS (+)   Anesthesia Other Findings   Reproductive/Obstetrics                             Anesthesia Physical Anesthesia Plan  ASA: III  Anesthesia Plan: General   Post-op Pain Management:    Induction: Intravenous  PONV Risk Score and Plan: Propofol infusion  Airway Management Planned: Nasal Cannula  Additional Equipment:   Intra-op Plan:   Post-operative Plan:   Informed Consent: I have reviewed the patients History and Physical, chart, labs and discussed the procedure including the risks, benefits and alternatives for the proposed anesthesia with the patient or authorized representative who has indicated his/her understanding and acceptance.     Dental advisory given  Plan Discussed with: CRNA and Surgeon  Anesthesia Plan Comments:         Anesthesia Quick Evaluation

## 2018-07-04 NOTE — Op Note (Signed)
[facility] Patient Name: Daniel Bell Procedure Date: 07/04/2018 8:01 AM MRN: [id number] Account #: [id number] Date of Birth: September 11, 1960 Admit Type: Outpatient Age: 58 Room: [facility] ENDO ROOM 3 Gender: Male Note Status: Finalized Procedure:            Colonoscopy Indications:          Screening for colorectal malignant neoplasm Providers:            Lin Landsman MD, MD Medicines:            Monitored Anesthesia Care Complications:        No immediate complications. Estimated blood loss:                        Minimal. Procedure:            Pre-Anesthesia Assessment:                       - Prior to the procedure, a History and Physical was                        performed, and patient medications and allergies were                        reviewed. The patient is competent. The risks and                        benefits of the procedure and the sedation options and                        risks were discussed with the patient. All questions                        were answered and informed consent was obtained.                        Patient identification and proposed procedure were                        verified by the physician, the nurse, the                        anesthesiologist, the anesthetist and the technician in                        the pre-procedure area in the procedure room in the                        endoscopy suite. Mental Status Examination: alert and                        oriented. Airway Examination: normal oropharyngeal                        airway and neck mobility. Respiratory Examination:                        clear to auscultation. CV Examination: normal.                        Prophylactic Antibiotics: The patient  does not require                        prophylactic antibiotics. Prior Anticoagulants: The                        patient has taken no previous anticoagulant or                        antiplatelet agents.  ASA Grade Assessment: II - A                        patient with mild systemic disease. After reviewing the                        risks and benefits, the patient was deemed in                        satisfactory condition to undergo the procedure. The                        anesthesia plan was to use monitored anesthesia care                        (MAC). Immediately prior to administration of                        medications, the patient was re-assessed for adequacy                        to receive sedatives. The heart rate, respiratory rate,                        oxygen saturations, blood pressure, adequacy of                        pulmonary ventilation, and response to care were                        monitored throughout the procedure. The physical status                        of the patient was re-assessed after the procedure.                       After obtaining informed consent, the colonoscope was                        passed under direct vision. Throughout the procedure,                        the patient's blood pressure, pulse, and oxygen                        saturations were monitored continuously. The                        Colonoscope was introduced through the anus and                        advanced to the the  cecum, identified by appendiceal                        orifice and ileocecal valve. The colonoscopy was                        unusually difficult due to unsatisfactory bowel prep                        and significant looping. Successful completion of the                        procedure was aided by applying abdominal pressure. The                        patient tolerated the procedure well. The quality of                        the bowel preparation was adequate to identify polyps 6                        mm and larger in size. Findings:      The perianal and digital rectal examinations were normal. Pertinent       negatives include normal sphincter  tone and no palpable rectal lesions.      Copious quantities of semi-liquid stool was found in the entire colon,       precluding visualization. Lavage of the area was performed using 200 -       500 mL of sterile water, resulting in clearance with fair visualization.      A 6 mm polyp was found in the cecum. The polyp was sessile. The polyp       was removed with a cold snare. Resection and retrieval were complete.      A 6 mm polyp was found in the ascending colon. The polyp was sessile.       The polyp was removed with a cold snare. Resection and retrieval were       complete. To prevent bleeding after the polypectomy, two hemostatic       clips were successfully placed (MR conditional). There was no bleeding       at the end of the procedure.      A 8 mm polyp was found in the ascending colon. The polyp was sessile.       The polyp was removed with a hot snare. Resection and retrieval were       complete. To prevent bleeding after the polypectomy, one hemostatic clip       was successfully placed (MR conditional). There was no bleeding at the       end of the procedure.      A 6 mm polyp was found in the descending colon. The polyp was       semi-pedunculated. The polyp was removed with a hot snare. Resection and       retrieval were complete.      A 5 mm polyp was found in the sigmoid colon. The polyp was sessile. The       polyp was removed with a cold snare. Resection and retrieval were       complete.      A 6 mm polyp was found in the sigmoid colon. The polyp  was sessile. The       polyp was removed with a hot snare. Resection and retrieval were       complete.      Non-bleeding external hemorrhoids were found during retroflexion. The       hemorrhoids were large. Impression:           - Stool in the entire examined colon.                       - One 6 mm polyp in the cecum, removed with a cold                        snare. Resected and retrieved.                       - One 6  mm polyp in the ascending colon, removed with a                        cold snare. Resected and retrieved. Clips (MR                        conditional) were placed.                       - One 8 mm polyp in the ascending colon, removed with a                        hot snare. Resected and retrieved. Clip (MR                        conditional) was placed.                       - One 6 mm polyp in the descending colon, removed with                        a hot snare. Resected and retrieved.                       - One 5 mm polyp in the sigmoid colon, removed with a                        cold snare. Resected and retrieved.                       - One 6 mm polyp in the sigmoid colon, removed with a                        hot snare. Resected and retrieved.                       - Non-bleeding external hemorrhoids. Recommendation:       - Discharge patient to home (with escort).                       - Resume previous diet today.                       - Continue present medications.                       -  Await pathology results.                       - Repeat colonoscopy in 1 year because the bowel                        preparation was poor. Procedure Code(s):    --- Professional ---                       [phone], Colonoscopy, flexible; with removal of tumor(s),                        polyp(s), or other lesion(s) by snare technique Diagnosis Code(s):    --- Professional ---                       Z12.11, Encounter for screening for malignant neoplasm                        of colon                       K64.4, Residual hemorrhoidal skin tags                       D12.0, Benign neoplasm of cecum                       D12.2, Benign neoplasm of ascending colon                       D12.4, Benign neoplasm of descending colon                       D12.5, Benign neoplasm of sigmoid colon CPT copyright 2018 American Medical Association. All rights reserved. The codes documented in this report  are preliminary and upon coder review may  be revised to meet current compliance requirements. Dr. Ulyess Mort Lin Landsman MD, MD 07/04/2018 9:19:31 AM This report has been signed electronically. Number of Addenda: 0 Note Initiated On: 07/04/2018 8:01 AM Scope Withdrawal Time: 0 hours 32 minutes 55 seconds  Total Procedure Duration: 0 hours 42 minutes 53 seconds       [facility]

## 2018-07-04 NOTE — Addendum Note (Signed)
Addendum  created 07/04/18 1311 by Alvin Critchley, MD   Intraprocedure Flowsheets edited, Intraprocedure Meds edited

## 2018-07-04 NOTE — Anesthesia Postprocedure Evaluation (Signed)
Anesthesia Post Note  Patient: Daniel Bell  Procedure(s) Performed: COLONOSCOPY WITH PROPOFOL (N/A )  Patient location during evaluation: Endoscopy Anesthesia Type: General Level of consciousness: awake and alert and oriented Pain management: pain level controlled Vital Signs Assessment: post-procedure vital signs reviewed and stable Respiratory status: spontaneous breathing Cardiovascular status: blood pressure returned to baseline Anesthetic complications: no     Last Vitals:  Vitals:   07/04/18 0707 07/04/18 0923  BP: 121/81 120/69  Pulse: 77 69  Resp: 18 14  Temp: (!) 35.6 C (!) 36.2 C  SpO2: 100% 100%    Last Pain:  Vitals:   07/04/18 0923  TempSrc: Tympanic  PainSc: 0-No pain                 Shakina Choy

## 2018-07-04 NOTE — H&P (Signed)
Daniel Darby, MD [address]  [place]  [place], [place] [address]  Main: [phone]  Fax: [phone] Pager: [phone]  Primary Care Physician:  Daniel Hess, MD Primary Gastroenterologist:  Dr. Cephas Bell  Pre-Procedure History & Physical: HPI:  Daniel Bell is a 58 y.o. male is here for an colonoscopy.   Past Medical History:  Diagnosis Date  . Cervical pain   . Diabetes mellitus without complication ([place])   . Hyperlipidemia   . Hypertension   . Numbness of finger 5th digit right hand     Past Surgical History:  Procedure Laterality Date  . HIP SURGERY Right    as a baby for leg length discrepancy    Prior to Admission medications   Medication Sig Start Date End Date Taking? Authorizing Provider  ACCU-CHEK FASTCLIX LANCETS MISC 2 (two) times daily. for testing 08/31/14  Yes [provider]  ACCU-CHEK SMARTVIEW test strip 1 each by Other route 2 (two) times daily as needed. 11/22/14  Yes Plonk, Gwyndolyn Saxon, MD  amLODipine-benazepril (LOTREL) 5-20 MG capsule TAKE 1 CAPSULE BY MOUTH EVERY DAY 02/17/18  Yes Daniel Hess, MD  aspirin 81 MG tablet Take 1 tablet (81 mg total) by mouth daily. 06/17/15  Yes Plonk, Gwyndolyn Saxon, MD  Blood Glucose Monitoring Suppl (ACCU-CHEK NANO SMARTVIEW) W/DEVICE KIT  08/30/14  Yes [provider]  Dulaglutide (TRULICITY) 1.5 TZ/0.0FV SOPN Inject 1.5 mg into the skin once a week. 06/08/18  Yes Daniel Hess, MD  glipiZIDE (GLUCOTROL) 5 MG tablet TAKE 1 TABLET (5 MG TOTAL) BY MOUTH 2 (TWO) TIMES DAILY BEFORE A MEAL. 02/17/18  Yes Daniel Hess, MD  metFORMIN (GLUCOPHAGE) 1000 MG tablet TAKE 1 TABLET BY MOUTH TWICE A DAY 02/17/18  Yes Daniel Hess, MD  Multiple Vitamins-Minerals (MULTIVITAMIN WITH MINERALS) tablet Take 1 tablet by mouth daily.   Yes [provider]  simvastatin (ZOCOR) 40 MG tablet TAKE 1 TABLET BY MOUTH EVERY DAY 02/17/18  Yes Daniel Hess, MD  sitaGLIPtin (JANUVIA) 100 MG tablet  Take 1 tablet (100 mg total) by mouth daily. 03/23/17  Yes Daniel Hess, MD  cyclobenzaprine (FLEXERIL) 10 MG tablet Take 1 tablet (10 mg total) by mouth 3 (three) times daily as needed for muscle spasms. 06/08/18   Daniel Hess, MD  meloxicam (MOBIC) 15 MG tablet Take 1 tablet (15 mg total) by mouth daily as needed for pain. Patient not taking: Reported on 07/04/2018 06/08/18   Daniel Hess, MD  naproxen (NAPROSYN) 500 MG tablet Take 1 tablet (500 mg total) by mouth 2 (two) times daily. 07/19/17   Wynona Luna, MD    Allergies as of 06/10/2018  . (No Known Allergies)    Family History  Problem Relation Age of Onset  . Diabetes Mother     Social History   Socioeconomic History  . Marital status: Single    Spouse name: Not on file  . Number of children: Not on file  . Years of education: Not on file  . Highest education level: Not on file  Occupational History  . Occupation: bus driver    Comment: and custodian  Social Needs  . Financial resource strain: Not on file  . Food insecurity:    Worry: Not on file    Inability: Not on file  . Transportation needs:    Medical: Not on file    Non-medical: Not on file  Tobacco Use  . Smoking status: Current Every  Day Smoker    Types: Cigars    Last attempt to quit: 08/11/2017    Years since quitting: 0.8  . Smokeless tobacco: Never Used  . Tobacco comment: 3-5 cigars  Substance and Sexual Activity  . Alcohol use: No    Alcohol/week: 0.0 standard drinks  . Drug use: No  . Sexual activity: Yes  Lifestyle  . Physical activity:    Days per week: Not on file    Minutes per session: Not on file  . Stress: Not on file  Relationships  . Social connections:    Talks on phone: Not on file    Gets together: Not on file    Attends religious service: Not on file    Active member of club or organization: Not on file    Attends meetings of clubs or organizations: Not on file    Relationship status: Not on file  .  Intimate partner violence:    Fear of current or ex partner: Not on file    Emotionally abused: Not on file    Physically abused: Not on file    Forced sexual activity: Not on file  Other Topics Concern  . Not on file  Social History Narrative  . Not on file    Review of Systems: See HPI, otherwise negative ROS  Physical Exam: BP 121/81   Pulse 77   Temp (!) 96 F (35.6 C) (Tympanic)   Resp 18   Ht _0  (1.6 m)   Wt 64.9 kg   SpO2 100%   BMI 25.33 kg/m  General:   Alert,  pleasant and cooperative in NAD Head:  Normocephalic and atraumatic. Neck:  Supple; no masses or thyromegaly. Lungs:  Clear throughout to auscultation.    Heart:  Regular rate and rhythm. Abdomen:  Soft, nontender and nondistended. Normal bowel sounds, without guarding, and without rebound.   Neurologic:  Alert and  oriented x4;  grossly normal neurologically.  Impression/Plan: Daniel Bell is here for an colonoscopy to be performed for colon cancer screening  Risks, benefits, limitations, and alternatives regarding  colonoscopy have been reviewed with the patient.  Questions have been answered.  All parties agreeable.   Daniel Sear, MD  07/04/2018, 8:19 AM

## 2018-07-05 LAB — SURGICAL PATHOLOGY

## 2018-07-06 ENCOUNTER — Encounter: Payer: Self-pay | Admitting: Gastroenterology

## 2018-10-06 ENCOUNTER — Other Ambulatory Visit: Payer: Self-pay | Admitting: Internal Medicine

## 2018-10-06 DIAGNOSIS — M25551 Pain in right hip: Secondary | ICD-10-CM

## 2018-10-07 ENCOUNTER — Encounter: Payer: Self-pay | Admitting: Internal Medicine

## 2018-10-07 ENCOUNTER — Other Ambulatory Visit
Admission: RE | Admit: 2018-10-07 | Discharge: 2018-10-07 | Disposition: A | Discharge status: Home / Self Care | Payer: BC Managed Care – PPO | Attending: Internal Medicine | Admitting: Internal Medicine

## 2018-10-07 ENCOUNTER — Other Ambulatory Visit: Payer: Self-pay

## 2018-10-07 ENCOUNTER — Ambulatory Visit (INDEPENDENT_AMBULATORY_CARE_PROVIDER_SITE_OTHER): Payer: BC Managed Care – PPO | Admitting: Internal Medicine

## 2018-10-07 VITALS — BP 112/70 | HR 79 | Ht 63.0 in | Wt 142.6 lb

## 2018-10-07 DIAGNOSIS — E1165 Type 2 diabetes mellitus with hyperglycemia: Secondary | ICD-10-CM | POA: Insufficient documentation

## 2018-10-07 DIAGNOSIS — Z7984 Long term (current) use of oral hypoglycemic drugs: Secondary | ICD-10-CM | POA: Diagnosis not present

## 2018-10-07 DIAGNOSIS — Z7901 Long term (current) use of anticoagulants: Secondary | ICD-10-CM | POA: Insufficient documentation

## 2018-10-07 DIAGNOSIS — Z79899 Other long term (current) drug therapy: Secondary | ICD-10-CM | POA: Insufficient documentation

## 2018-10-07 DIAGNOSIS — F17210 Nicotine dependence, cigarettes, uncomplicated: Secondary | ICD-10-CM | POA: Diagnosis not present

## 2018-10-07 DIAGNOSIS — I10 Essential (primary) hypertension: Secondary | ICD-10-CM

## 2018-10-07 DIAGNOSIS — Z7982 Long term (current) use of aspirin: Secondary | ICD-10-CM | POA: Diagnosis not present

## 2018-10-07 DIAGNOSIS — M25551 Pain in right hip: Secondary | ICD-10-CM

## 2018-10-07 DIAGNOSIS — IMO0001 Reserved for inherently not codable concepts without codable children: Secondary | ICD-10-CM

## 2018-10-07 LAB — HEMOGLOBIN A1C
Hgb A1c MFr Bld: 6.8 % — ABNORMAL HIGH (ref 4.8–5.6)
Mean Plasma Glucose: 148.46 mg/dL

## 2018-10-07 NOTE — Patient Instructions (Signed)
Schedule your annual Diabetic Eye Exam

## 2018-10-07 NOTE — Progress Notes (Signed)
Date:  10/07/2018   Name:  Daniel Bell   DOB:  15-Feb-1961   MRN:  [id number]   Chief Complaint: Diabetes (4 month follow up.) and Hypertension  Diabetes  Pertinent negatives for hypoglycemia include no dizziness or tremors. Pertinent negatives for diabetes include no fatigue, no polydipsia and no polyuria.  Hypertension  Pertinent negatives include no palpitations or shortness of breath.  Hip Pain   There was no injury mechanism. The pain is present in the right hip. The pain is mild. The pain has been intermittent since onset. He has tried NSAIDs for the symptoms. The treatment provided significant relief.   Lab Results  Component Value Date   HGBA1C 6.9 (H) 06/09/2018   Lab Results  Component Value Date   CREATININE 0.84 06/09/2018   BUN 14 06/09/2018   NA 134 (L) 06/09/2018   K 4.3 06/09/2018   CL 101 06/09/2018   CO2 26 06/09/2018     Review of Systems  Constitutional: Negative for appetite change, fatigue and unexpected weight change.  Eyes: Negative for visual disturbance.  Respiratory: Negative for cough, shortness of breath and wheezing.   Cardiovascular: Negative for palpitations and leg swelling.  Gastrointestinal: Negative for blood in stool.  Endocrine: Negative for polydipsia and polyuria.  Genitourinary: Negative for hematuria.  Musculoskeletal: Positive for arthralgias.  Skin: Negative for color change and rash.  Allergic/Immunologic: Negative for environmental allergies.  Neurological: Negative for dizziness and tremors.  Psychiatric/Behavioral: Negative for dysphoric mood.    Patient Active Problem List   Diagnosis Date Noted  . Encounter for screening colonoscopy   . Pain of right hip joint 08/25/2017  . Hematuria, microscopic 08/25/2017  . Cervical stenosis of spinal canal 12/10/2015  . Neural foraminal stenosis of cervical spine 12/10/2015  . Cervical radiculopathy 11/22/2015  . Diabetes mellitus type 2, uncontrolled, without complications ([place])  17/49/4496  . Essential (primary) hypertension 11/22/2014  . Glaucoma 11/22/2014  . Bilateral hearing loss 11/22/2014  . Hyperlipidemia associated with type 2 diabetes mellitus ([place]) 11/22/2014    No Known Allergies  Past Surgical History:  Procedure Laterality Date  . COLONOSCOPY WITH PROPOFOL N/A 07/04/2018   Procedure: COLONOSCOPY WITH PROPOFOL;  Surgeon: Lin Landsman, MD;  Location: [facility] ENDOSCOPY;  Service: Gastroenterology;  Laterality: N/A;  . HIP SURGERY Right    as a baby for leg length discrepancy    Social History   Tobacco Use  . Smoking status: Current Every Day Smoker    Packs/day: 1.00    Years: 30.00    Pack years: 30.00    Types: Cigars, Cigarettes    Last attempt to quit: 08/11/2017    Years since quitting: 1.1  . Smokeless tobacco: Never Used  . Tobacco comment: 3-5 cigars a week  Substance Use Topics  . Alcohol use: No    Alcohol/week: 0.0 standard drinks  . Drug use: No     Medication list has been reviewed and updated.  Current Meds  Medication Sig  . ACCU-CHEK FASTCLIX LANCETS MISC 2 (two) times daily. for testing  . ACCU-CHEK SMARTVIEW test strip 1 each by Other route 2 (two) times daily as needed.  Marland Kitchen amLODipine-benazepril (LOTREL) 5-20 MG capsule TAKE 1 CAPSULE BY MOUTH EVERY DAY  . aspirin 81 MG tablet Take 1 tablet (81 mg total) by mouth daily.  . Blood Glucose Monitoring Suppl (ACCU-CHEK NANO SMARTVIEW) W/DEVICE KIT   . cyclobenzaprine (FLEXERIL) 10 MG tablet Take 1 tablet (10 mg total) by mouth 3 (three)  times daily as needed for muscle spasms.  . Dulaglutide (TRULICITY) 1.5 EP/3.2RJ SOPN Inject 1.5 mg into the skin once a week.  Marland Kitchen glipiZIDE (GLUCOTROL) 5 MG tablet TAKE 1 TABLET (5 MG TOTAL) BY MOUTH 2 (TWO) TIMES DAILY BEFORE A MEAL.  . meloxicam (MOBIC) 15 MG tablet TAKE 1 TABLET BY MOUTH EVERY DAY AS NEEDED FOR PAIN  . metFORMIN (GLUCOPHAGE) 1000 MG tablet TAKE 1 TABLET BY MOUTH TWICE A DAY  . Multiple Vitamins-Minerals  (MULTIVITAMIN WITH MINERALS) tablet Take 1 tablet by mouth daily.  . naproxen (NAPROSYN) 500 MG tablet Take 1 tablet (500 mg total) by mouth 2 (two) times daily.  . simvastatin (ZOCOR) 40 MG tablet TAKE 1 TABLET BY MOUTH EVERY DAY  . sitaGLIPtin (JANUVIA) 100 MG tablet Take 1 tablet (100 mg total) by mouth daily.    PHQ 2/9 Scores 10/07/2018 03/23/2017 10/16/2015 06/17/2015  PHQ - 2 Score 0 0 0 0    BP Readings from Last 3 Encounters:  10/07/18 112/70  07/04/18 117/63  06/08/18 128/78    Physical Exam Constitutional:      Appearance: Normal appearance.  HENT:     Mouth/Throat:     Mouth: Mucous membranes are moist.  Eyes:     Pupils: Pupils are equal, round, and reactive to light.  Neck:     Musculoskeletal: Normal range of motion and neck supple.  Cardiovascular:     Rate and Rhythm: Normal rate and regular rhythm.     Pulses: Normal pulses.  Pulmonary:     Effort: Pulmonary effort is normal.     Breath sounds: Normal breath sounds. No wheezing.  Musculoskeletal:     Right lower leg: No edema.     Left lower leg: No edema.  Lymphadenopathy:     Cervical: No cervical adenopathy.  Neurological:     Mental Status: He is alert.  Psychiatric:        Mood and Affect: Mood normal.        Thought Content: Thought content normal.     Wt Readings from Last 3 Encounters:  10/07/18 142 lb 9.6 oz (64.7 kg)  07/04/18 143 lb (64.9 kg)  06/08/18 144 lb 6.4 oz (65.5 kg)    BP 112/70   Pulse 79   Ht 5' 3" (1.6 m)   Wt 142 lb 9.6 oz (64.7 kg)   SpO2 100%   BMI 25.26 kg/m   Assessment and Plan: 1. Diabetes mellitus type 2, uncontrolled, without complications (H[facility]) Controlled with 4 agents, now off of Actos - Hemoglobin A1c  2. Essential (primary) hypertension Controlled on ACE/CCB  3. Pain of right hip joint Continue Mobic - PRN only   Partially dictated using Editor, commissioning. Any errors are unintentional.  [name], MD [facility]  10/07/2018

## 2018-10-10 ENCOUNTER — Telehealth: Payer: Self-pay

## 2018-10-10 NOTE — Telephone Encounter (Signed)
Patient informed of recent labs. Told A1C is better.

## 2018-12-06 ENCOUNTER — Telehealth: Payer: Self-pay | Admitting: Internal Medicine

## 2018-12-06 NOTE — Telephone Encounter (Signed)
Patient is needing a referral for ENT.

## 2018-12-06 NOTE — Telephone Encounter (Signed)
Spoke with patient he said he had lost an hearing aid and not sure what doctor he saw, possibly dr Vicente Masson and will need referral.

## 2018-12-06 NOTE — Telephone Encounter (Signed)
I spoke with Dr. Army Melia she said ENT does not prescribe hearing aids. Only hearing aid stores do. He will need to google and find a hearing aid store or medical supply store near his area to get a new aid.  Please inform patient. Thank you for your help!!!

## 2018-12-06 NOTE — Telephone Encounter (Signed)
For what? We have no reason in his chart to send him to and ENT. If he wants a referral for a new problem, he needs to make an appointment to be seen before a referral can be sent.  Please schedule appt for the problem he is having. Thank you!

## 2019-02-08 ENCOUNTER — Ambulatory Visit (INDEPENDENT_AMBULATORY_CARE_PROVIDER_SITE_OTHER): Payer: BC Managed Care – PPO | Admitting: Internal Medicine

## 2019-02-08 ENCOUNTER — Encounter: Payer: Self-pay | Admitting: Internal Medicine

## 2019-02-08 ENCOUNTER — Other Ambulatory Visit
Admission: RE | Admit: 2019-02-08 | Discharge: 2019-02-08 | Disposition: A | Discharge status: Home / Self Care | Payer: BC Managed Care – PPO | Attending: Internal Medicine | Admitting: Internal Medicine

## 2019-02-08 ENCOUNTER — Other Ambulatory Visit: Payer: Self-pay

## 2019-02-08 VITALS — BP 106/78 | HR 83 | Ht 63.0 in | Wt 142.0 lb

## 2019-02-08 DIAGNOSIS — M76892 Other specified enthesopathies of left lower limb, excluding foot: Secondary | ICD-10-CM | POA: Diagnosis not present

## 2019-02-08 DIAGNOSIS — E118 Type 2 diabetes mellitus with unspecified complications: Secondary | ICD-10-CM | POA: Insufficient documentation

## 2019-02-08 DIAGNOSIS — Z23 Encounter for immunization: Secondary | ICD-10-CM

## 2019-02-08 DIAGNOSIS — E1169 Type 2 diabetes mellitus with other specified complication: Secondary | ICD-10-CM

## 2019-02-08 DIAGNOSIS — I10 Essential (primary) hypertension: Secondary | ICD-10-CM | POA: Diagnosis not present

## 2019-02-08 DIAGNOSIS — E785 Hyperlipidemia, unspecified: Secondary | ICD-10-CM

## 2019-02-08 MED ORDER — GLIPIZIDE 5 MG PO TABS: 5.0000 mg | ORAL_TABLET | Freq: Two times a day (BID) | ORAL | 3 refills | Status: DC

## 2019-02-08 MED ORDER — SIMVASTATIN 40 MG PO TABS: 40.0000 mg | ORAL_TABLET | Freq: Every day | ORAL | 3 refills | Status: DC

## 2019-02-08 MED ORDER — METFORMIN HCL 1000 MG PO TABS: 1000.0000 mg | ORAL_TABLET | Freq: Two times a day (BID) | ORAL | 3 refills | Status: DC

## 2019-02-08 MED ORDER — TRULICITY 1.5 MG/0.5ML ~~LOC~~ SOAJ: 1.5000 mg | SUBCUTANEOUS | 12 refills | Status: DC

## 2019-02-08 NOTE — Progress Notes (Signed)
Date:  02/08/2019   Name:  Daniel Bell   DOB:  1961-02-13   MRN:  [id number]   Chief Complaint: Diabetes (Foot exam. Flu shot. )  Diabetes He presents for his follow-up diabetic visit. He has type 2 diabetes mellitus. His disease course has been stable. Pertinent negatives for hypoglycemia include no headaches, nervousness/anxiousness or tremors. Pertinent negatives for diabetes include no chest pain, no fatigue, no polydipsia and no polyuria. Symptoms are stable. Current diabetic treatment includes oral agent (triple therapy) (glipizide, metformin, Trulicity). He is compliant with treatment all of the time. His weight is stable. He is following a generally healthy diet. There is no compliance with monitoring of blood glucose. An ACE inhibitor/angiotensin II receptor blocker is being taken. Eye exam is not current.  Hypertension This is a chronic problem. The problem is controlled. Pertinent negatives include no chest pain, headaches, palpitations or shortness of breath. Past treatments include calcium channel blockers and ACE inhibitors. The current treatment provides significant improvement. There are no compliance problems.   Knee Pain  There was no injury mechanism. The pain is present in the left knee. The quality of the pain is described as aching. The pain is mild. The pain has been fluctuating since onset. Pertinent negatives include no loss of sensation, muscle weakness or numbness. He reports no foreign bodies present. He has tried NSAIDs and rest for the symptoms. The treatment provided mild relief.   Lab Results  Component Value Date   HGBA1C 6.8 (H) 10/07/2018   Lab Results  Component Value Date   CHOL 123 06/09/2018   HDL 35 (L) 06/09/2018   LDLCALC 60 06/09/2018   TRIG 142 06/09/2018   CHOLHDL 3.5 06/09/2018   Lab Results  Component Value Date   CREATININE 0.84 06/09/2018   BUN 14 06/09/2018   NA 134 (L) 06/09/2018   K 4.3 06/09/2018   CL 101 06/09/2018   CO2 26  06/09/2018     Review of Systems  Constitutional: Negative for appetite change, fatigue and unexpected weight change.  Eyes: Negative for visual disturbance.  Respiratory: Negative for cough, shortness of breath and wheezing.   Cardiovascular: Negative for chest pain, palpitations and leg swelling.  Gastrointestinal: Negative for abdominal pain and blood in stool.  Endocrine: Negative for polydipsia and polyuria.  Genitourinary: Negative for dysuria and hematuria.  Musculoskeletal: Positive for arthralgias (left knee) and gait problem. Negative for joint swelling and myalgias.  Skin: Negative for color change and rash.  Neurological: Negative for tremors, numbness and headaches.  Psychiatric/Behavioral: Negative for dysphoric mood and sleep disturbance. The patient is not nervous/anxious.     Patient Active Problem List   Diagnosis Date Noted  . Encounter for screening colonoscopy   . Pain of right hip joint 08/25/2017  . Hematuria, microscopic 08/25/2017  . Cervical stenosis of spinal canal 12/10/2015  . Neural foraminal stenosis of cervical spine 12/10/2015  . Cervical radiculopathy 11/22/2015  . Type II diabetes mellitus with complication ([place]) 15/72/6203  . Essential (primary) hypertension 11/22/2014  . Glaucoma 11/22/2014  . Bilateral hearing loss 11/22/2014  . Hyperlipidemia associated with type 2 diabetes mellitus ([place]) 11/22/2014    No Known Allergies  Past Surgical History:  Procedure Laterality Date  . COLONOSCOPY WITH PROPOFOL N/A 07/04/2018   Procedure: COLONOSCOPY WITH PROPOFOL;  Surgeon: Lin Landsman, MD;  [facility]: [facility] END[facility]COPY;  Service: Gastroenterology;  Laterality: N/A;  . HIP SURGERY Right    as a baby for leg length discrepancy  Social History   Tobacco Use  . Smoking status: Current Every Day Smoker    Packs/day: 1.00    Years: 30.00    Pack years: 30.00    Types: Cigars, Cigarettes    Last attempt to quit: 08/11/2017    Years since  quitting: 1.4  . Smokeless tobacco: Never Used  . Tobacco comment: 3-5 cigars a week  Substance Use Topics  . Alcohol use: No    Alcohol/week: 0.0 standard drinks  . Drug use: No     Medication list has been reviewed and updated.  Current Meds  Medication Sig  . ACCU-CHEK FASTCLIX LANCETS MISC 2 (two) times daily. for testing  . ACCU-CHEK SMARTVIEW test strip 1 each by Other route 2 (two) times daily as needed.  Marland Kitchen amLODipine-benazepril (LOTREL) 5-20 MG capsule TAKE 1 CAPSULE BY MOUTH EVERY DAY  . aspirin 81 MG tablet Take 1 tablet (81 mg total) by mouth daily.  . Blood Glucose Monitoring Suppl (ACCU-CHEK NANO SMARTVIEW) W/DEVICE KIT   . cyclobenzaprine (FLEXERIL) 10 MG tablet Take 1 tablet (10 mg total) by mouth 3 (three) times daily as needed for muscle spasms.  . Dulaglutide (TRULICITY) 1.5 FM/3.8GY SOPN Inject 1.5 mg into the skin once a week.  Marland Kitchen glipiZIDE (GLUCOTROL) 5 MG tablet Take 1 tablet (5 mg total) by mouth 2 (two) times daily before a meal.  . meloxicam (MOBIC) 15 MG tablet TAKE 1 TABLET BY MOUTH EVERY DAY AS NEEDED FOR PAIN  . metFORMIN (GLUCOPHAGE) 1000 MG tablet Take 1 tablet (1,000 mg total) by mouth 2 (two) times daily.  . Multiple Vitamins-Minerals (MULTIVITAMIN WITH MINERALS) tablet Take 1 tablet by mouth daily.  . naproxen (NAPROSYN) 500 MG tablet Take 1 tablet (500 mg total) by mouth 2 (two) times daily.  . simvastatin (ZOCOR) 40 MG tablet Take 1 tablet (40 mg total) by mouth daily.  . [DISCONTINUED] Dulaglutide (TRULICITY) 1.5 KZ/9.9JT SOPN Inject 1.5 mg into the skin once a week.  . [DISCONTINUED] glipiZIDE (GLUCOTROL) 5 MG tablet TAKE 1 TABLET (5 MG TOTAL) BY MOUTH 2 (TWO) TIMES DAILY BEFORE A MEAL.  . [DISCONTINUED] metFORMIN (GLUCOPHAGE) 1000 MG tablet TAKE 1 TABLET BY MOUTH TWICE A DAY  . [DISCONTINUED] simvastatin (ZOCOR) 40 MG tablet TAKE 1 TABLET BY MOUTH EVERY DAY  . [DISCONTINUED] sitaGLIPtin (JANUVIA) 100 MG tablet Take 1 tablet (100 mg total) by mouth  daily.    PHQ 2/9 Scores 02/08/2019 10/07/2018 03/23/2017 10/16/2015  PHQ - 2 Score 0 0 0 0  PHQ- 9 Score 0 - - -    BP Readings from Last 3 Encounters:  02/08/19 106/78  10/07/18 112/70  07/04/18 117/63    Physical Exam Vitals signs and nursing note reviewed.  Constitutional:      General: He is not in acute distress.    Appearance: Normal appearance. He is well-developed.  HENT:     Head: Normocephalic and atraumatic.  Neck:     Musculoskeletal: Normal range of motion.  Cardiovascular:     Rate and Rhythm: Normal rate and regular rhythm.     Heart sounds: No murmur.  Pulmonary:     Effort: Pulmonary effort is normal. No respiratory distress.     Breath sounds: No wheezing or rhonchi.  Musculoskeletal: Normal range of motion.     Left knee: He exhibits no swelling, no effusion and no ecchymosis. Tenderness found. Patellar tendon tenderness noted.  Skin:    General: Skin is warm and dry.     Capillary  Refill: Capillary refill takes less than 2 seconds.     Findings: No rash.  Neurological:     General: No focal deficit present.     Mental Status: He is alert and oriented to person, place, and time.  Psychiatric:        Behavior: Behavior normal.        Thought Content: Thought content normal.     Wt Readings from Last 3 Encounters:  02/08/19 142 lb (64.4 kg)  10/07/18 142 lb 9.6 oz (64.7 kg)  07/04/18 143 lb (64.9 kg)    BP 106/78   Pulse 83   Ht 5' 3"  (1.6 m)   Wt 142 lb (64.4 kg)   SpO2 98%   BMI 25.15 kg/m   Assessment and Plan: 1. Type II diabetes mellitus with complication (HCC) Clinically stable by exam and report without s/s of hypoglycemia. DM complicated by htn, lipids. Tolerating medications Trulicity, glipizide and metformin well without side effects or other concerns. - Hemoglobin A1c - glipiZIDE (GLUCOTROL) 5 MG tablet; Take 1 tablet (5 mg total) by mouth 2 (two) times daily before a meal.  Dispense: 180 tablet; Refill: 3 - metFORMIN  (GLUCOPHAGE) 1000 MG tablet; Take 1 tablet (1,000 mg total) by mouth 2 (two) times daily.  Dispense: 180 tablet; Refill: 3 - Dulaglutide (TRULICITY) 1.5 IZ/1.2WP SOPN; Inject 1.5 mg into the skin once a week.  Dispense: 4 pen; Refill: 12  2. Essential (primary) hypertension Clinically stable exam with well controlled BP.   Tolerating medications, lotrel 5/20, without side effects at this time. Pt to continue current regimen and low sodium diet; benefits of regular exercise as able discussed.   3. Tendonitis of left knee Recommend Ice several times per day Topical rubs Return if worsening  4. Hyperlipidemia associated with type 2 diabetes mellitus ([place]) Tolerating statin medication without side effects at this time LDL is at goal of < 70 on current dose of simvastatin 40 mg Continue same therapy without change at this time. - simvastatin (ZOCOR) 40 MG tablet; Take 1 tablet (40 mg total) by mouth daily.  Dispense: 90 tablet; Refill: 3   Partially dictated using Editor, commissioning. Any errors are unintentional.  [name], MD [facility]  02/08/2019

## 2019-02-10 LAB — HEMOGLOBIN A1C
Hgb A1c MFr Bld: 8.8 % — ABNORMAL HIGH (ref 4.8–5.6)
Mean Plasma Glucose: 206 mg/dL

## 2019-02-24 ENCOUNTER — Other Ambulatory Visit: Payer: Self-pay | Admitting: Internal Medicine

## 2019-04-12 ENCOUNTER — Other Ambulatory Visit: Payer: Self-pay

## 2019-04-12 ENCOUNTER — Other Ambulatory Visit
Admission: RE | Admit: 2019-04-12 | Discharge: 2019-04-12 | Disposition: A | Discharge status: Home / Self Care | Payer: BC Managed Care – PPO | Attending: Internal Medicine | Admitting: Internal Medicine

## 2019-04-12 ENCOUNTER — Ambulatory Visit (INDEPENDENT_AMBULATORY_CARE_PROVIDER_SITE_OTHER): Payer: Medicare Other | Admitting: Internal Medicine

## 2019-04-12 ENCOUNTER — Encounter: Payer: Self-pay | Admitting: Internal Medicine

## 2019-04-12 VITALS — BP 122/70 | HR 105 | Ht 63.0 in | Wt 143.2 lb

## 2019-04-12 DIAGNOSIS — E118 Type 2 diabetes mellitus with unspecified complications: Secondary | ICD-10-CM | POA: Diagnosis not present

## 2019-04-12 DIAGNOSIS — I10 Essential (primary) hypertension: Secondary | ICD-10-CM

## 2019-04-12 LAB — HEMOGLOBIN A1C
Hgb A1c MFr Bld: 7.7 % — ABNORMAL HIGH (ref 4.8–5.6)
Mean Plasma Glucose: 174.29 mg/dL

## 2019-04-12 MED ORDER — BLOOD GLUCOSE MONITOR KIT: PACK | 0 refills | Status: AC

## 2019-04-12 NOTE — Patient Instructions (Signed)
Check blood sugar before eating several days per week and also check blood sugar 2 hours after your evening meal.

## 2019-04-12 NOTE — Progress Notes (Signed)
Date:  04/12/2019   Name:  Daniel Bell   DOB:  02-27-1961   MRN:  [id number]   Chief Complaint: Diabetes (Yesterday Dentist supposed to take out 3 teeth. BS was 230 after eating 1.5 hour before. Patient wants BS kit sent to pharmacy for him to test BS at home. )  Diabetes He presents for his follow-up diabetic visit. He has type 2 diabetes mellitus. His disease course has been improving. Pertinent negatives for diabetes include no chest pain, no polydipsia and no polyuria. Current diabetic treatment includes insulin injections and oral agent (dual therapy) (glucotrol and metformin). He is compliant with treatment all of the time. There is no compliance with monitoring of blood glucose.    Lab Results  Component Value Date   CREATININE 0.84 06/09/2018   BUN 14 06/09/2018   NA 134 (L) 06/09/2018   K 4.3 06/09/2018   CL 101 06/09/2018   CO2 26 06/09/2018   Lab Results  Component Value Date   CHOL 123 06/09/2018   HDL 35 (L) 06/09/2018   LDLCALC 60 06/09/2018   TRIG 142 06/09/2018   CHOLHDL 3.5 06/09/2018   No results found for: TSH Lab Results  Component Value Date   HGBA1C 8.8 (H) 02/08/2019     Review of Systems  Constitutional: Negative for fever.  HENT: Negative for trouble swallowing.   Eyes: Negative for visual disturbance.  Respiratory: Negative for shortness of breath.   Cardiovascular: Negative for chest pain.  Gastrointestinal: Negative for abdominal pain.  Endocrine: Negative for polydipsia and polyuria.    Patient Active Problem List   Diagnosis Date Noted  . Encounter for screening colonoscopy   . Pain of right hip joint 08/25/2017  . Hematuria, microscopic 08/25/2017  . Cervical stenosis of spinal canal 12/10/2015  . Neural foraminal stenosis of cervical spine 12/10/2015  . Cervical radiculopathy 11/22/2015  . Type II diabetes mellitus with complication ([place]) 57/97/2820  . Essential (primary) hypertension 11/22/2014  . Glaucoma 11/22/2014  .  Bilateral hearing loss 11/22/2014  . Hyperlipidemia associated with type 2 diabetes mellitus ([place]) 11/22/2014    No Known Allergies  Past Surgical History:  Procedure Laterality Date  . COLONOSCOPY WITH PROPOFOL N/A 07/04/2018   Procedure: COLONOSCOPY WITH PROPOFOL;  Surgeon: Lin Landsman, MD;  Location: [facility] ENDOSCOPY;  Service: Gastroenterology;  Laterality: N/A;  . HIP SURGERY Right    as a baby for leg length discrepancy    Social History   Tobacco Use  . Smoking status: Current Every Day Smoker    Packs/day: 1.00    Years: 30.00    Pack years: 30.00    Types: Cigars, Cigarettes    Last attempt to quit: 08/11/2017    Years since quitting: 1.6  . Smokeless tobacco: Never Used  . Tobacco comment: 3-5 cigars a week  Substance Use Topics  . Alcohol use: No    Alcohol/week: 0.0 standard drinks  . Drug use: No     Medication list has been reviewed and updated.  Current Meds  Medication Sig  . ACCU-CHEK FASTCLIX LANCETS MISC 2 (two) times daily. for testing  . ACCU-CHEK SMARTVIEW test strip 1 each by Other route 2 (two) times daily as needed.  Marland Kitchen amLODipine-benazepril (LOTREL) 5-20 MG capsule TAKE 1 CAPSULE BY MOUTH EVERY DAY  . Blood Glucose Monitoring Suppl (ACCU-CHEK NANO SMARTVIEW) W/DEVICE KIT   . Dulaglutide (TRULICITY) 1.5 UO/1.5IF SOPN Inject 1.5 mg into the skin once a week.  Marland Kitchen glipiZIDE (GLUCOTROL) 5  MG tablet Take 1 tablet (5 mg total) by mouth 2 (two) times daily before a meal.  . metFORMIN (GLUCOPHAGE) 1000 MG tablet Take 1 tablet (1,000 mg total) by mouth 2 (two) times daily.  . Multiple Vitamins-Minerals (MULTIVITAMIN WITH MINERALS) tablet Take 1 tablet by mouth daily.  . naproxen (NAPROSYN) 500 MG tablet Take 1 tablet (500 mg total) by mouth 2 (two) times daily.  . simvastatin (ZOCOR) 40 MG tablet Take 1 tablet (40 mg total) by mouth daily.    PHQ 2/9 Scores 02/08/2019 10/07/2018 03/23/2017 10/16/2015  PHQ - 2 Score 0 0 0 0  PHQ- 9 Score 0 - - -    BP  Readings from Last 3 Encounters:  04/12/19 122/70  02/08/19 106/78  10/07/18 112/70    Physical Exam Vitals signs and nursing note reviewed.  Constitutional:      General: He is not in acute distress.    Appearance: He is well-developed.  HENT:     Head: Normocephalic and atraumatic.  Neck:     Musculoskeletal: Normal range of motion.  Cardiovascular:     Rate and Rhythm: Normal rate and regular rhythm.     Pulses: Normal pulses.     Heart sounds: No murmur.  Pulmonary:     Effort: Pulmonary effort is normal. No respiratory distress.     Breath sounds: No wheezing or rhonchi.  Musculoskeletal:     Right lower leg: No edema.     Left lower leg: No edema.  Lymphadenopathy:     Cervical: No cervical adenopathy.  Skin:    General: Skin is warm and dry.     Findings: No rash.  Neurological:     Mental Status: He is alert and oriented to person, place, and time.  Psychiatric:        Behavior: Behavior normal.        Thought Content: Thought content normal.     Wt Readings from Last 3 Encounters:  04/12/19 143 lb 3.2 oz (65 kg)  02/08/19 142 lb (64.4 kg)  10/07/18 142 lb 9.6 oz (64.7 kg)    BP 122/70   Pulse (!) 105   Ht 5' 3"  (1.6 m)   Wt 143 lb 3.2 oz (65 kg)   SpO2 98%   BMI 25.37 kg/m   Assessment and Plan: 1. Type II diabetes mellitus with complication (HCC) Pt has been more compliant with he medications and working on diet.  He does not have a meter and wants to start checking his blood sugar. - blood glucose meter kit and supplies KIT; Dispense based on patient and insurance preference. Use up to four times daily as directed. (FOR ICD-9 250.00, 250.01).  Dispense: 1 each; Refill: 0 - Hemoglobin A1c  2. Essential (primary) hypertension Clinically stable exam with well controlled BP.   Tolerating medications, Lotrel 5/20 daily, without side effects at this time. Pt to continue current regimen and low sodium diet; benefits of regular exercise as able  discussed.   Partially dictated using Editor, commissioning. Any errors are unintentional.  [name], MD [facility]  04/12/2019

## 2019-05-08 ENCOUNTER — Other Ambulatory Visit: Payer: Self-pay | Admitting: Internal Medicine

## 2019-06-12 ENCOUNTER — Encounter: Payer: BC Managed Care – PPO | Admitting: Internal Medicine

## 2019-06-15 ENCOUNTER — Ambulatory Visit: Payer: BC Managed Care – PPO | Admitting: Internal Medicine

## 2019-06-23 ENCOUNTER — Telehealth: Payer: Self-pay

## 2019-06-23 NOTE — Telephone Encounter (Signed)
Patient called saying he woke up with a bad headache last night, burning in his chest and he felt like he had to burp but he could not. Ended up vomiting one time instead of burping.  Wants to know if he should be tested for covid. He has no fever, SOB, cough, loss of taste or smell.  Informed him per Dr Army Melia that its most likely acid reflux that he experienced and not covid. No covid testing is needed at this time. Told pt to try a antiacid medication OTC as needed.

## 2019-07-21 ENCOUNTER — Encounter: Payer: BC Managed Care – PPO | Admitting: Internal Medicine

## 2019-08-16 ENCOUNTER — Encounter: Payer: BC Managed Care – PPO | Admitting: Internal Medicine

## 2020-08-16 ENCOUNTER — Telehealth: Payer: Self-pay

## 2020-08-16 NOTE — Telephone Encounter (Signed)
Called patient and left VM per Dr Army Melia that his appt Monday will be for diabetes and htn and NOT a CPE. Since we have not seen him in so long, we need to address his problem list first.

## 2020-08-19 ENCOUNTER — Encounter: Payer: Self-pay | Admitting: Internal Medicine

## 2020-08-19 ENCOUNTER — Other Ambulatory Visit
Admission: RE | Admit: 2020-08-19 | Discharge: 2020-08-19 | Disposition: A | Discharge status: Home / Self Care | Payer: BC Managed Care – PPO | Attending: Internal Medicine | Admitting: Internal Medicine

## 2020-08-19 ENCOUNTER — Ambulatory Visit (INDEPENDENT_AMBULATORY_CARE_PROVIDER_SITE_OTHER): Payer: BC Managed Care – PPO | Admitting: Internal Medicine

## 2020-08-19 ENCOUNTER — Other Ambulatory Visit: Payer: Self-pay

## 2020-08-19 VITALS — BP 144/94 | HR 91 | Temp 97.8°F | Ht 63.0 in | Wt 134.0 lb

## 2020-08-19 DIAGNOSIS — E1169 Type 2 diabetes mellitus with other specified complication: Secondary | ICD-10-CM | POA: Diagnosis not present

## 2020-08-19 DIAGNOSIS — I10 Essential (primary) hypertension: Secondary | ICD-10-CM | POA: Insufficient documentation

## 2020-08-19 DIAGNOSIS — E785 Hyperlipidemia, unspecified: Secondary | ICD-10-CM | POA: Insufficient documentation

## 2020-08-19 DIAGNOSIS — E118 Type 2 diabetes mellitus with unspecified complications: Secondary | ICD-10-CM | POA: Diagnosis not present

## 2020-08-19 LAB — CBC WITH DIFFERENTIAL/PLATELET
Abs Immature Granulocytes: 0.02 10*3/uL (ref 0.00–0.07)
Basophils Absolute: 0.1 10*3/uL (ref 0.0–0.1)
Basophils Relative: 1 %
Eosinophils Absolute: 0.2 10*3/uL (ref 0.0–0.5)
Eosinophils Relative: 2 %
HCT: 47.8 % (ref 39.0–52.0)
Hemoglobin: 17.4 g/dL — ABNORMAL HIGH (ref 13.0–17.0)
Immature Granulocytes: 0 %
Lymphocytes Relative: 28 %
Lymphs Abs: 2.1 10*3/uL (ref 0.7–4.0)
MCH: 31.2 pg (ref 26.0–34.0)
MCHC: 36.4 g/dL — ABNORMAL HIGH (ref 30.0–36.0)
MCV: 85.7 fL (ref 80.0–100.0)
Monocytes Absolute: 0.6 10*3/uL (ref 0.1–1.0)
Monocytes Relative: 8 %
Neutro Abs: 4.6 10*3/uL (ref 1.7–7.7)
Neutrophils Relative %: 61 %
Platelets: 218 10*3/uL (ref 150–400)
RBC: 5.58 MIL/uL (ref 4.22–5.81)
RDW: 12.6 % (ref 11.5–15.5)
WBC: 7.6 10*3/uL (ref 4.0–10.5)
nRBC: 0 % (ref 0.0–0.2)

## 2020-08-19 LAB — LIPID PANEL
Cholesterol: 119 mg/dL (ref 0–200)
HDL: 40 mg/dL — ABNORMAL LOW (ref >40)
LDL Cholesterol: 53 mg/dL (ref 0–99)
Total CHOL/HDL Ratio: 3 RATIO
Triglycerides: 130 mg/dL (ref <150)
VLDL: 26 mg/dL (ref 0–40)

## 2020-08-19 LAB — COMPREHENSIVE METABOLIC PANEL
ALT: 27 U/L (ref 0–44)
AST: 23 U/L (ref 15–41)
Albumin: 4.4 g/dL (ref 3.5–5.0)
Alkaline Phosphatase: 141 U/L — ABNORMAL HIGH (ref 38–126)
Anion gap: 9 (ref 5–15)
BUN: 14 mg/dL (ref 6–20)
CO2: 27 mmol/L (ref 22–32)
Calcium: 9.7 mg/dL (ref 8.9–10.3)
Chloride: 96 mmol/L — ABNORMAL LOW (ref 98–111)
Creatinine, Ser: 0.85 mg/dL (ref 0.61–1.24)
GFR, Estimated: >60 mL/min (ref >60)
Glucose, Bld: 380 mg/dL — ABNORMAL HIGH (ref 70–99)
Potassium: 4.5 mmol/L (ref 3.5–5.1)
Sodium: 132 mmol/L — ABNORMAL LOW (ref 135–145)
Total Bilirubin: 1 mg/dL (ref 0.3–1.2)
Total Protein: 7.7 g/dL (ref 6.5–8.1)

## 2020-08-19 LAB — POCT URINALYSIS DIPSTICK
Bilirubin, UA: NEGATIVE
Glucose, UA: POSITIVE — AB
Ketones, UA: NEGATIVE
Leukocytes, UA: NEGATIVE
Nitrite, UA: NEGATIVE
Protein, UA: NEGATIVE
Spec Grav, UA: 1.015 (ref 1.010–1.025)
Urobilinogen, UA: 0.2 E.U./dL
pH, UA: 6 (ref 5.0–8.0)

## 2020-08-19 LAB — HEMOGLOBIN A1C
Hgb A1c MFr Bld: 10.9 % — ABNORMAL HIGH (ref 4.8–5.6)
Mean Plasma Glucose: 266.13 mg/dL

## 2020-08-19 LAB — POCT UA - MICROALBUMIN: Microalbumin Ur, POC: 20 mg/L

## 2020-08-19 MED ORDER — GLIPIZIDE 5 MG PO TABS: 5.0000 mg | ORAL_TABLET | Freq: Two times a day (BID) | ORAL | 1 refills | Status: DC

## 2020-08-19 MED ORDER — SIMVASTATIN 40 MG PO TABS: 40.0000 mg | ORAL_TABLET | Freq: Every day | ORAL | 1 refills | Status: DC

## 2020-08-19 MED ORDER — LISINOPRIL 20 MG PO TABS: 20.0000 mg | ORAL_TABLET | Freq: Every day | ORAL | 3 refills | Status: DC

## 2020-08-19 MED ORDER — METFORMIN HCL 1000 MG PO TABS: 1000.0000 mg | ORAL_TABLET | Freq: Two times a day (BID) | ORAL | 1 refills | Status: DC

## 2020-08-19 NOTE — Patient Instructions (Signed)
Schedule a Diabetic Eye exam.  Farxiga 5 mg - take one a day in addition to your other diabetic medication.

## 2020-08-19 NOTE — Progress Notes (Signed)
Date:  08/19/2020   Name:  Daniel Bell   DOB:  [birth year]   MRN:  [id number]   Chief Complaint: Diabetes and Hypertension DM eye exam overdue. Diabetes He presents for his follow-up diabetic visit. He has type 2 diabetes mellitus. His disease course has been stable. Pertinent negatives for hypoglycemia include no headaches or tremors. Pertinent negatives for diabetes include no chest pain, no fatigue, no polydipsia and no polyuria. Current diabetic treatments: metformin, glipizide, trulicity. He is compliant with treatment most of the time. His weight is stable. There is no compliance with monitoring of blood glucose. An ACE inhibitor/angiotensin II receptor blocker is being taken. Eye exam is not current.  Hypertension This is a chronic problem. The problem is controlled. Pertinent negatives include no chest pain, headaches, palpitations or shortness of breath. Past treatments include ACE inhibitors and calcium channel blockers. The current treatment provides moderate improvement. There is no history of kidney disease or CAD/MI.  Hyperlipidemia This is a chronic problem. The problem is controlled. Pertinent negatives include no chest pain or shortness of breath. Current antihyperlipidemic treatment includes statins. The current treatment provides significant improvement of lipids.    Lab Results  Component Value Date   CREATININE 0.84 06/09/2018   BUN 14 06/09/2018   NA 134 (L) 06/09/2018   K 4.3 06/09/2018   CL 101 06/09/2018   CO2 26 06/09/2018   Lab Results  Component Value Date   CHOL 123 06/09/2018   HDL 35 (L) 06/09/2018   LDLCALC 60 06/09/2018   TRIG 142 06/09/2018   CHOLHDL 3.5 06/09/2018   No results found for: TSH Lab Results  Component Value Date   HGBA1C 7.7 (H) 04/12/2019   Lab Results  Component Value Date   WBC 7.2 06/09/2018   HGB 15.7 06/09/2018   HCT 44.2 06/09/2018   MCV 87.4 06/09/2018   PLT 203 06/09/2018   Lab Results  Component Value Date   ALT  12 06/09/2018   AST 17 06/09/2018   ALKPHOS 107 06/09/2018   BILITOT 1.1 06/09/2018     Review of Systems  Constitutional: Negative for appetite change, fatigue and unexpected weight change.  Eyes: Negative for visual disturbance.  Respiratory: Negative for cough, shortness of breath and wheezing.   Cardiovascular: Negative for chest pain, palpitations and leg swelling.  Gastrointestinal: Negative for abdominal pain and blood in stool.  Endocrine: Negative for polydipsia and polyuria.  Genitourinary: Negative for dysuria, hematuria and urgency.  Skin: Negative for color change and rash.  Neurological: Negative for tremors, numbness and headaches.  Psychiatric/Behavioral: Negative for dysphoric mood.    Patient Active Problem List   Diagnosis Date Noted  . Encounter for screening colonoscopy   . Pain of right hip joint 08/25/2017  . Hematuria, microscopic 08/25/2017  . Cervical stenosis of spinal canal 12/10/2015  . Neural foraminal stenosis of cervical spine 12/10/2015  . Cervical radiculopathy 11/22/2015  . Type II diabetes mellitus with complication ([place]) 25/09/3974  . Essential (primary) hypertension 11/22/2014  . Glaucoma 11/22/2014  . Bilateral hearing loss 11/22/2014  . Hyperlipidemia associated with type 2 diabetes mellitus ([place]) 11/22/2014    Allergies  Allergen Reactions  . Trulicity [Dulaglutide] Diarrhea    Past Surgical History:  Procedure Laterality Date  . COLONOSCOPY WITH PROPOFOL N/A 07/04/2018   Procedure: COLONOSCOPY WITH PROPOFOL;  Surgeon: Lin Landsman, MD;  Location: [facility] ENDOSCOPY;  Service: Gastroenterology;  Laterality: N/A;  . HIP SURGERY Right    as a baby for  leg length discrepancy    Social History   Tobacco Use  . Smoking status: Current Every Day Smoker    Packs/day: 1.00    Years: 30.00    Pack years: 30.00    Types: Cigars, Cigarettes    Last attempt to quit: 08/11/2017    Years since quitting: 3.0  . Smokeless tobacco:  Never Used  . Tobacco comment: 3-5 cigars a week  Vaping Use  . Vaping Use: Never used  Substance Use Topics  . Alcohol use: No    Alcohol/week: 0.0 standard drinks  . Drug use: No     Medication list has been reviewed and updated.  Current Meds  Medication Sig  . glipiZIDE (GLUCOTROL) 5 MG tablet Take 1 tablet (5 mg total) by mouth 2 (two) times daily before a meal.  . metFORMIN (GLUCOPHAGE) 1000 MG tablet Take 1 tablet (1,000 mg total) by mouth 2 (two) times daily.  . Multiple Vitamins-Minerals (MULTIVITAMIN WITH MINERALS) tablet Take 1 tablet by mouth daily.    PHQ 2/9 Scores 08/19/2020 02/08/2019 10/07/2018 03/23/2017  PHQ - 2 Score 0 0 0 0  PHQ- 9 Score 0 0 - -    GAD 7 : Generalized Anxiety Score 08/19/2020  Nervous, Anxious, on Edge 0  Control/stop worrying 0  Worry too much - different things 0  Trouble relaxing 0  Restless 0  Easily annoyed or irritable 0  Afraid - awful might happen 0  Total GAD 7 Score 0  Anxiety Difficulty Not difficult at all    BP Readings from Last 3 Encounters:  08/19/20 (!) 144/94  04/12/19 122/70  02/08/19 106/78    Physical Exam Vitals and nursing note reviewed.  Constitutional:      Appearance: Normal appearance. He is well-developed.  HENT:     Head: Normocephalic.  Eyes:     Conjunctiva/sclera: Conjunctivae normal.     Pupils: Pupils are equal, round, and reactive to light.  Neck:     Thyroid: No thyromegaly.     Vascular: No carotid bruit.  Cardiovascular:     Rate and Rhythm: Normal rate and regular rhythm.     Pulses: Normal pulses.     Heart sounds: Normal heart sounds.  Pulmonary:     Effort: Pulmonary effort is normal.     Breath sounds: Normal breath sounds. No wheezing.  Chest:  Breasts:     Right: No mass.     Left: No mass.    Abdominal:     Tenderness: There is no abdominal tenderness.  Musculoskeletal:        General: Normal range of motion.     Cervical back: Normal range of motion and neck supple.      Right lower leg: No edema.     Left lower leg: No edema.  Lymphadenopathy:     Cervical: No cervical adenopathy.  Skin:    General: Skin is warm and dry.     Capillary Refill: Capillary refill takes less than 2 seconds.  Neurological:     General: No focal deficit present.     Mental Status: He is alert and oriented to person, place, and time.     Deep Tendon Reflexes: Reflexes are normal and symmetric.  Psychiatric:        Attention and Perception: Attention normal.        Mood and Affect: Mood normal.        Behavior: Behavior normal.        Thought Content: Thought  content normal.     Wt Readings from Last 3 Encounters:  08/19/20 134 lb (60.8 kg)  04/12/19 143 lb 3.2 oz (65 kg)  02/08/19 142 lb (64.4 kg)    BP (!) 144/94   Pulse 91   Temp 97.8 F (36.6 C) (Oral)   Ht 5\' 3"  (1.6 m)   Wt 134 lb (60.8 kg)   SpO2 97%   BMI 23.74 kg/m   Assessment and Plan: 1. Essential (primary) hypertension Not controlled due to medication non compliance Resume medication with lisinopril 20 mg Check urine and labs - CBC with Differential/Platelet - POCT urinalysis dipstick - lisinopril (ZESTRIL) 20 MG tablet; Take 1 tablet (20 mg total) by mouth daily.  Dispense: 90 tablet; Refill: 3 - AMB Ref[facility]  2. Type II diabetes mellitus with complication (HCC) Did not tolerate Trulicity. Samples of Farxiga 5 mg given - will instruct to start once labs return - Comprehensive metabolic panel - Hemoglobin A1c - POCT UA - Microalbumin - glipiZIDE (GLUCOTROL) 5 MG tablet; Take 1 tablet (5 mg total) by mouth 2 (two) times daily before a meal.  Dispense: 180 tablet; Refill: 1 - metFORMIN (GLUCOPHAGE) 1000 MG tablet; Take 1 tablet (1,000 mg total) by mouth 2 (two) times daily.  Dispense: 180 tablet; Refill: 1 - AMB Referral to [facility]  3. Hyperlipidemia associated with type 2 diabetes mellitus (HCC) Resume simvastatin 40 mg and check labs -  Lipid panel - simvastatin (ZOCOR) 40 MG tablet; Take 1 tablet (40 mg total) by mouth daily.  Dispense: 90 tablet; Refill: 1 - AMB Referral to [facility]   Partially dictated using [employer]. Any errors are unintentional.  [name], MD [facility]  08/19/2020

## 2020-08-21 ENCOUNTER — Telehealth: Payer: Self-pay

## 2020-08-21 NOTE — Chronic Care Management (AMB) (Signed)
  Chronic Care Management   Note  08/21/2020 Name: [name] MRN: [id number] DOB: Feb 15, 1961  Daniel Bell is a 60 y.o. year old male who is a primary care patient of Glean Hess, MD. I reached out to [name] by phone today in response to a referral sent by [name]'s PCP, Glean Hess, MD     [name] was given information about Chronic Care Management services today including:  1. CCM service includes personalized support from designated clinical staff supervised by his physician, including individualized plan of care and coordination with other care providers 2. 24/7 contact phone numbers for assistance for urgent and routine care needs. 3. Service will only be billed when office clinical staff spend 20 minutes or more in a month to coordinate care. 4. Only one practitioner may furnish and bill the service in a calendar month. 5. The patient may stop CCM services at any time (effective at the end of the month) by phone call to the office staff. 6. The patient will be responsible for cost sharing (co-pay) of up to 20% of the service fee (after annual deductible is met).  Patient agreed to services and verbal consent obtained.   Follow up plan: Telephone appointment with care management team member scheduled for:08/27/2020  [name], [place], [employer], [place] [address] Direct Dial: [phone] [name] Website: [email or web address]

## 2020-08-22 NOTE — Progress Notes (Signed)
Chronic Care Management Pharmacy Note  08/27/2020 Name:  Daniel Bell MRN:  [id number] DOB:  1961-02-09  Subjective: Daniel Bell is an 60 y.o. year old male who is a primary patient of Army Melia, Daniel Sans, MD.  The CCM team was consulted for assistance with disease management and care coordination needs.    Engaged with patient by telephone for initial visit in response to provider referral for pharmacy case management and/or care coordination services. Visit was abbreviated today as patient received another call. Attempted to outreach several times without success. Left VM for him to return my call.  Consent to Services:  The patient was given the following information about Chronic Care Management services today, agreed to services, and gave verbal consent: 1. CCM service includes personalized support from designated clinical staff supervised by the primary care provider, including individualized plan of care and coordination with other care providers 2. 24/7 contact phone numbers for assistance for urgent and routine care needs. 3. Service will only be billed when office clinical staff spend 20 minutes or more in a month to coordinate care. 4. Only one practitioner may furnish and bill the service in a calendar month. 5.The patient may stop CCM services at any time (effective at the end of the month) by phone call to the office staff. 6. The patient will be responsible for cost sharing (co-pay) of up to 20% of the service fee (after annual deductible is met). Patient agreed to services and consent obtained.  Patient Care Team: Glean Hess, MD as PCP - General (Internal Medicine) Ashok Pall, MD as Consulting Physician (Neurosurgery) [facility] (Ophthalmology) Vladimir Faster, [facility] (Pharmacist)  Recent office visits: 08/19/20- berglund(PCP)- blood work, lisinopril 20 mg, microalumin urine 20, BP 144/94  Recent consult visits: None  Hospital visits: None in previous 6  months  Objective:  Lab Results  Component Value Date   CREATININE 0.85 08/19/2020   BUN 14 08/19/2020   GFRNONAA >60 08/19/2020   GFRAA >60 06/09/2018   NA 132 (L) 08/19/2020   K 4.5 08/19/2020   CALCIUM 9.7 08/19/2020   CO2 27 08/19/2020   GLUCOSE 380 (H) 08/19/2020    Lab Results  Component Value Date/Time   HGBA1C 10.9 (H) 08/19/2020 10:59 AM   HGBA1C 7.7 (H) 04/12/2019 02:17 PM   MICROALBUR 20 08/19/2020 10:53 AM    Last diabetic Eye exam:  Lab Results  Component Value Date/Time   HMDIABEYEEXA No Retinopathy 11/23/2017 12:00 AM    Last diabetic Foot exam: No results found for: HMDIABFOOTEX   Lab Results  Component Value Date   CHOL 119 08/19/2020   HDL 40 (L) 08/19/2020   LDLCALC 53 08/19/2020   TRIG 130 08/19/2020   CHOLHDL 3.0 08/19/2020    Hepatic Function Latest Ref Rng & Units 08/19/2020 06/09/2018 08/25/2017  Total Protein 6.5 - 8.1 g/dL 7.7 7.7 7.8  Albumin 3.5 - 5.0 g/dL 4.4 4.5 4.3  AST 15 - 41 U/L _0 ALT 0 - 44 U/L 27 12 16(L)  Alk Phosphatase 38 - 126 U/L 141(H) 107 111  Total Bilirubin 0.3 - 1.2 mg/dL 1.0 1.1 0.8    No results found for: TSH, FREET4  CBC Latest Ref Rng & Units 08/19/2020 06/09/2018 01/10/2018  WBC 4.0 - 10.5 K/uL 7.6 7.2 8.8  Hemoglobin 13.0 - 17.0 g/dL 17.4(H) 15.7 15.7  Hematocrit 39.0 - 52.0 % 47.8 44.2 44.9  Platelets 150 - 400 K/uL 218 203 230    No  results found for: VD25OH  Clinical ASCVD: No  The ASCVD Risk score Mikey Bussing DC Jr., et al., 2013) failed to calculate for the following reasons:   The valid total cholesterol range is 130 to 320 mg/dL   Unable to determine if patient is Non-Hispanic African American    Depression screen [facility] 2/9 08/19/2020 02/08/2019 10/07/2018  Decreased Interest 0 0 0  Down, Depressed, Hopeless 0 0 0  PHQ - 2 Score 0 0 0  Altered sleeping 0 0 -  Tired, decreased energy 0 0 -  Change in appetite 0 0 -  Feeling bad or failure about yourself  0 0 -  Trouble concentrating 0 0 -  Moving  slowly or fidgety/restless 0 0 -  Suicidal thoughts 0 0 -  PHQ-9 Score 0 0 -  Difficult doing work/chores Not difficult at all - -      Social History   Tobacco Use  Smoking Status Current Every Day Smoker  . Packs/day: 1.00  . Years: 30.00  . Pack years: 30.00  . Types: Cigars, Cigarettes  . Last attempt to quit: 08/11/2017  . Years since quitting: 3.0  Smokeless Tobacco Never Used  Tobacco Comment   3-5 cigars a week   BP Readings from Last 3 Encounters:  08/19/20 (!) 144/94  04/12/19 122/70  02/08/19 106/78   Pulse Readings from Last 3 Encounters:  08/19/20 91  04/12/19 (!) 105  02/08/19 83   Wt Readings from Last 3 Encounters:  08/19/20 134 lb (60.8 kg)  04/12/19 143 lb 3.2 oz (65 kg)  02/08/19 142 lb (64.4 kg)   BMI Readings from Last 3 Encounters:  08/19/20 23.74 kg/m  04/12/19 25.37 kg/m  02/08/19 25.15 kg/m    Assessment/Interventions: Review of patient past medical history, allergies, medications, health status, including review of consultants reports, laboratory and other test data, was performed as part of comprehensive evaluation and provision of chronic care management services.   SDOH:  (Social Determinants of Health) assessments and interventions performed: No  SDOH Screenings   Alcohol Screen: Not on file  Depression (PHQ2-9): Low Risk   . PHQ-2 Score: 0  Financial Resource Strain: Not on file  Food Insecurity: Not on file  Housing: Not on file  Physical Activity: Not on file  Social Connections: Not on file  Stress: Not on file  Tobacco Use: High Risk  . Smoking Tobacco Use: Current Every Day Smoker  . Smokeless Tobacco Use: Never Used  Transportation Needs: Not on file     Immunization History  Administered Date(s) Administered  . Influenza,inj,Quad PF,6+ Mos 06/17/2015, 03/23/2017, 06/08/2018, 02/08/2019  . PPD Test 08/12/2015  . Pneumococcal Polysaccharide-23 06/17/2015  . Tdap 08/08/2015    Conditions to be  addressed/monitored:  Hypertension, Hyperlipidemia and Diabetes and tobacco use  Care Plan : [place]  Updates made by Vladimir Faster, [place] since 08/27/2020 12:00 AM    Problem: DM, HLD, HTN   Priority: High    Goal: Disease Management   Start Date: 08/27/2020  This Visit's Progress: Not on track  Priority: High  Note:   Current Barriers:  . Unable to independently afford treatment regimen . Unable to independently monitor therapeutic efficacy . Unable to achieve control of diabetes  . Suboptimal therapeutic regimen for diabetes . Unable to self administer medications as prescribed . Does not adhere to prescribed medication regimen . Does not maintain contact with provider office .   Pharmacist Clinical Goal(s):  Marland Kitchen Patient will verbalize  ability to afford treatment regimen . achieve adherence to monitoring guidelines and medication adherence to achieve therapeutic efficacy . achieve control of diabetes as evidenced by lab values . adhere to plan to optimize therapeutic regimen for diabetes as evidenced by report of adherence to recommended medication management changes . adhere to prescribed medication regimen as evidenced by lab values, fill history . contact provider office for questions/concerns as evidenced notation of same in electronic health record through collaboration with PharmD and provider.  .   Interventions: . 1:1 collaboration with Glean Hess, MD regarding development and update of comprehensive plan of care as evidenced by provider attestation and co-signature . Inter-disciplinary care team collaboration (see longitudinal plan of care) . Comprehensive medication review performed; medication list updated in electronic medical record  BP Readings from Last 3 Encounters:  08/19/20 (!) 144/94  04/12/19 122/70  02/08/19 106/78    Hypertension (BP goal <130/80) -Uncontrolled -Current treatment: . Lisinopril 20 mg qd (LF  08/19/20) -Medications previously tried:NA -Current home readings: NA  -Denies hypotensive/hypertensive symptoms -Educated on BP goals and benefits of medications for prevention of heart attack, stroke and kidney damage; Daily salt intake goal < 2300 mg; Importance of home blood pressure monitoring; -Counseled to monitor BP at home 2-3 times weekly, document, and provide log at future appointments -Recommended to continue current medication    The ASCVD Risk score Mikey Bussing DC Jr., et al., 2013) failed to calculate for the following reasons:   The valid total cholesterol range is 130 to 320 mg/dL   Unable to determine if patient is Non-Hispanic African American Lab Results  Component Value Date   LDLCALC 53 08/19/2020    Hyperlipidemia: (LDL goal < 70) -Controlled -Current treatment: . Simvastatin 40 mg qd -Medications previously tried: NA  --Educated on Benefits of statin for ASCVD risk reduction; -Recommended to continue current medication   Lab Results  Component Value Date   HGBA1C 10.9 (H) 08/19/2020   BMP Latest Ref Rng & Units 08/19/2020 06/09/2018 01/10/2018  Glucose 70 - 99 mg/dL 380(H) 154(H) 162(H)  BUN 6 - 20 mg/dL _0 Creatinine 0.61 - 1.24 mg/dL 0.85 0.84 0.91  BUN/Creat Ratio 9 - 20 - - -  Sodium 135 - 145 mmol/L 132(L) 134(L) 138  Potassium 3.5 - 5.1 mmol/L 4.5 4.3 4.3  Chloride 98 - 111 mmol/L 96(L) 101 104  CO2 22 - 32 mmol/L _1 Calcium 8.9 - 10.3 mg/dL 9.7 9.5 9.3  Estimated Creatinine Clearance: 74.4 mL/min (by C-G formula based on SCr of 0.85 mg/dL). Lab Results  Component Value Date   LABMICR 13.7 08/25/2017   LABMICR 9.4 10/16/2015   MICROALBUR 20 08/19/2020   Lab Results  Component Value Date   LABMICR 13.7 08/25/2017   LABMICR 9.4 10/16/2015   MICROALBUR 20 08/19/2020       Diabetes (A1c goal <7%) -Uncontrolled -Current medications: Marland Kitchen Metformin 1000 mg bid (LF 08/19/20) . Glipizide 5 bid (LF 08/19/20) . farxiga 5 mg   (samples) -Medications previously tried: Trulicity  -Current home glucose readings: does  not check does not have monitor . fasting glucose: None . post prandial glucose: None -Denies hypoglycemic/hyperglycemic symptoms -Current meal patterns: eats two - three meals daily unable to provide specifics . breakfast . lunch: peanut butter banana sandwich on potato bread . dinner:  . Snacks: cookies, bananas . drinks: water, coffee -Current exercise:  -Educated on Complications of diabetes including kidney damage, retinal damage, and cardiovascular disease; Benefits of  routine self-monitoring of blood sugar; mean plasma glucose fof 266 for A1c of 10.9 -Counseled to check feet daily and get yearly eye exams-- needs to schedule -Recommended Patient continue Farxiga, obtain BG monitor and begin checking BG Collaborated with insurance carrier to determine co-pay of Farxiga. Per, Kristeen Miss, the patient's coverage was terminated in December. Verified with [place] patient has medicare part D coverage and copay for Farxiga 10 mg will be $9.85 for a 3 month supply. Patient agreeable to obtaining glucometer and checking BG at home.   Patient Goals/Self-Care Activities . Patient will:  - take medications as prescribed check glucose before breakfast and 2-3 hours after dinner, document, and provide at future appointments check blood pressure daily, document, and provide at future appointments collaborate with provider on medication access solutions target a minimum of 150 minutes of moderate intensity exercise weekly engage in dietary modifications by reducing carbohydrates and sugary snacks  Follow Up Plan: Telephone follow up appointment with care management team member scheduled for: one month          Medication Assistance: Application for Farxiga  medication assistance program. in process.  Anticipated assistance start date unknown.  See plan of care for additional  detail.  Patient's preferred pharmacy is:  [facility], [address] [place] [address] Phone: [phone] Fax: [phone]  [id number] [id number]: [phone]: [phone] Uses pill box? Yes Pt endorses 80 % compliance  We discussed: Current pharmacy is preferred with insurance plan and patient is satisfied with pharmacy services Patient decided to: Continue current medication management strategy  Care Plan and Follow Up Patient Decision:  Patient agrees to Care Plan and Follow-up.  Plan: Telephone follow up appointment with care management team member scheduled for:  one month  JJunita Push HKenton KingfisherPharmD, [phone]

## 2020-08-27 ENCOUNTER — Ambulatory Visit (INDEPENDENT_AMBULATORY_CARE_PROVIDER_SITE_OTHER): Payer: BC Managed Care – PPO | Admitting: Pharmacist

## 2020-08-27 DIAGNOSIS — E118 Type 2 diabetes mellitus with unspecified complications: Secondary | ICD-10-CM | POA: Diagnosis not present

## 2020-08-27 DIAGNOSIS — I10 Essential (primary) hypertension: Secondary | ICD-10-CM

## 2020-08-27 NOTE — Patient Instructions (Addendum)
Visit Information  It was a pleasure speaking with you today! Thank you for letting me be a part of your care team. Please call with any questions or concerns.  Goals Addressed            This Visit's Progress   . Monitor and Manage My Blood Sugar-Diabetes Type 2       Timeframe:  Short-Term Goal Priority:  High Start Date:                             Expected End Date:                       Follow Up Date 1 month follow up - check blood sugar at prescribed times - enter blood sugar readings and medication or insulin into daily log - take the blood sugar log to all doctor visits - take the blood sugar meter to all doctor visits    Why is this important?    Checking your blood sugar at home helps to keep it from getting very high or very low.   Writing the results in a diary or log helps the doctor know how to care for you.   Your blood sugar log should have the time, date and the results.   Also, write down the amount of insulin or other medicine that you take.   Other information, like what you ate, exercise done and how you were feeling, will also be helpful.     Notes:     . Track and Manage My Blood Pressure-Hypertension       Timeframe:  Short-Term Goal Priority:  High Start Date:                             Expected End Date:                       Follow Up Date on month follow up   - check blood pressure daily - write blood pressure results in a log or diary    Why is this important?    You won't feel high blood pressure, but it can still hurt your blood vessels.   High blood pressure can cause heart or kidney problems. It can also cause a stroke.   Making lifestyle changes like losing a little weight or eating less salt will help.   Checking your blood pressure at home and at different times of the day can help to control blood pressure.   If the doctor prescribes medicine remember to take it the way the doctor ordered.   Call the office if you  cannot afford the medicine or if there are questions about it.     Notes:        Mr. Proch was given information about Chronic Care Management services today including:  1. CCM service includes personalized support from designated clinical staff supervised by his physician, including individualized plan of care and coordination with other care providers 2. 24/7 contact phone numbers for assistance for urgent and routine care needs. 3. Standard insurance, coinsurance, copays and deductibles apply for chronic care management only during months in which we provide at least 20 minutes of these services. Most insurances cover these services at 100%, however patients may be responsible for any copay, coinsurance and/or deductible if applicable. This service may help  you avoid the need for more expensive face-to-face services. 4. Only one practitioner may furnish and bill the service in a calendar month. 5. The patient may stop CCM services at any time (effective at the end of the month) by phone call to the office staff.  Patient agreed to services and verbal consent obtained.   The patient verbalized understanding of instructions, educational materials, and care plan provided today and agreed to receive a mailed copy of patient instructions, educational materials, and care plan.  The pharmacy team will reach out to the patient again over the next 30 days.   Junita Push. Kenton Kingfisher PharmD, BCPS Clinical Pharmacist [phone]  Blood Glucose Monitoring, Adult Monitoring your blood sugar (glucose) is an important part of managing your diabetes. Blood glucose monitoring involves checking your blood glucose as often as directed and keeping a log or record of your results over time. Checking your blood glucose regularly and keeping a blood glucose log can:  Help you and your health care provider adjust your diabetes management plan as needed, including your medicines or insulin.  Help you understand how  food, exercise, illnesses, and medicines affect your blood glucose.  Let you know what your blood glucose is at any time. You can quickly find out if you have low blood glucose (hypoglycemia) or high blood glucose (hyperglycemia). Your health care provider will set individualized treatment goals for you. Your goals will be based on your age, other medical conditions you have, and how you respond to diabetes treatment. Generally, the goal of treatment is to maintain the following blood glucose levels:  Before meals (preprandial): 80-130 mg/dL (4.4-7.2 mmol/L).  After meals (postprandial): below 180 mg/dL (10 mmol/L).  A1C level: less than 7%. Supplies needed:  Blood glucose meter.  Test strips for your meter. Each meter has its own strips. You must use the strips that came with your meter.  A needle to prick your finger (lancet). Do not use a lancet more than one time.  A device that holds the lancet (lancing device).  A journal or log book to write down your results. How to check your blood glucose Checking your blood glucose 1. Wash your hands for at least 20 seconds with soap and water. 2. Prick the side of your finger (not the tip) with the lancet. Do not use the same finger consecutively. 3. Gently rub the finger until a small drop of blood appears. 4. Follow instructions that come with your meter for inserting the test strip, applying blood to the strip, and using your blood glucose meter. 5. Write down your result and any notes in your log.   Using alternative sites Some meters allow you to use areas of your body other than your finger (alternative sites) to test your blood. The most common alternative sites are the forearm, the thigh, and the palm of your hand. Alternative sites may not be as accurate as the fingers because blood flow is slower in those areas. This means that the result you get may be delayed, and it may be different from the result that you would get from your  finger. Use the finger only, and do not use alternative sites, if:  You think you have hypoglycemia.  You sometimes do not know that your blood glucose is getting low (hypoglycemia unawareness). General tips and recommendations Blood glucose log  Every time you check your blood glucose, write down your result. Also write down any notes about things that may be affecting your blood  glucose, such as your diet and exercise for the day. This information can help you and your health care provider: ? Look for patterns in your blood glucose over time. ? Adjust your diabetes management plan as needed.  Check if your meter allows you to download your records to a computer or if there is an app for the meter. Most glucose meters store a record of glucose readings in the meter.   If you have type 1 diabetes:  Check your blood glucose 4 or more times a day if you are on intensive insulin therapy with multiple daily injections (MDI) or if you are using an insulin pump. Check your blood glucose: ? Before every meal and snack. ? Before bedtime.  Also check your blood glucose: ? If you have symptoms of hypoglycemia. ? After treating low blood glucose. ? Before doing activities that create a risk for injury, like driving or using machinery. ? Before and after exercise. ? Two hours after a meal. ? Occasionally between 2:00 a.m. and 3:00 a.m., as directed.  You may need to check your blood glucose more often, 6-10 times per day, if: ? You have diabetes that is not well controlled. ? You are ill. ? You have a history of severe hypoglycemia. ? You have hypoglycemia unawareness. If you have type 2 diabetes:  Check your blood glucose 2 or more times a day if you take insulin or other diabetes medicines.  Check your blood glucose 4 or more times a day if you are on intensive insulin therapy. Occasionally, you may also need to check your glucose between 2:00 a.m. and 3:00 a.m., as directed.  Also check  your blood glucose: ? Before and after exercise. ? Before doing activities that create a risk for injury, like driving or using machinery.  You may need to check your blood glucose more often if: ? Your medicine is being adjusted. ? Your diabetes is not well controlled. ? You are ill. General tips  Make sure you always have your supplies with you.  After you use a few boxes of test strips, adjust (calibrate) your blood glucose meter by following instructions that came with your meter.  If you have questions or need help, all blood glucose meters have a 24-hour hotline phone number available that you can call. Also contact your health care provider with questions or concerns you may have. Where to find more information  The American Diabetes Association: www.diabetes.org  The Association of Diabetes Care & Education Specialists: www.diabeteseducator.org Contact a health care provider if:  Your blood glucose is at or above 240 mg/dL (13.3 mmol/L) for 2 days in a row.  You have been sick or have had a fever for 2 days or longer, and you are not getting better.  You have any of the following problems for more than 6 hours: ? You cannot eat or drink. ? You have nausea or vomiting. ? You have diarrhea. Get help right away if:  Your blood glucose is lower than 54 mg/dL (3 mmol/L).  You become confused, or you have trouble thinking clearly.  You have difficulty breathing.  You have moderate or large ketone levels in your urine. These symptoms may represent a serious problem that is an emergency. Do not wait to see if the symptoms will go away. Get medical help right away. Call your local emergency services (911 in the [place] Do not drive yourself to the hospital. Summary  Monitoring your blood glucose is an important part  of managing your diabetes.  Blood glucose monitoring involves checking your blood glucose as often as directed and keeping a log or record of your results over  time.  Your health care provider will set individualized treatment goals for you. Your goals will be based on your age, other medical conditions you have, and how you respond to diabetes treatment.  Every time you check your blood glucose, write down your result. Also, write down any notes about things that may be affecting your blood glucose, such as your diet and exercise for the day. This information is not intended to replace advice given to you by your health care provider. Make sure you discuss any questions you have with your health care provider. Document Revised: 02/14/2020 Document Reviewed: 02/14/2020 Elsevier Patient Education  2021 [employer].

## 2020-08-28 ENCOUNTER — Telehealth: Payer: Self-pay

## 2020-08-28 ENCOUNTER — Other Ambulatory Visit: Payer: Self-pay

## 2020-08-28 DIAGNOSIS — E118 Type 2 diabetes mellitus with unspecified complications: Secondary | ICD-10-CM

## 2020-08-28 MED ORDER — DAPAGLIFLOZIN PROPANEDIOL 5 MG PO TABS: 5.0000 mg | ORAL_TABLET | Freq: Every day | ORAL | 0 refills | Status: DC

## 2020-08-28 NOTE — Progress Notes (Signed)
farxiga

## 2020-08-28 NOTE — Telephone Encounter (Unsigned)
Copied from [place] [phone]. Topic: General - Other >> Aug 28, 2020 10:26 AM Keene Breath wrote: Reason for CRM: Patient would like a refill on the samples that the doctor gave him a few weeks ago.  He did not know the name of the medication but he said he is running out and needs to get it refilled.  Please advise and call patient to discuss at [phone]

## 2020-08-28 NOTE — Telephone Encounter (Signed)
Called pt let him know that a prescription was sent to pharmacy. Pt verbalized understanding.  KP

## 2020-09-02 ENCOUNTER — Telehealth: Payer: Self-pay

## 2020-09-02 NOTE — Chronic Care Management (AMB) (Signed)
error 

## 2020-10-01 ENCOUNTER — Telehealth: Payer: Self-pay | Admitting: Pharmacist

## 2020-10-01 ENCOUNTER — Telehealth: Payer: BC Managed Care – PPO

## 2020-10-01 NOTE — Progress Notes (Deleted)
Chronic Care Management Pharmacy Note  10/01/2020 Name:  Daniel Bell MRN:  [id number] DOB:  Jul 29, 1960  Subjective: Daniel Bell is an 60 y.o. year old male who is a primary patient of Army Melia, Jesse Sans, MD.  The CCM team was consulted for assistance with disease management and care coordination needs.    Engaged with patient by telephone for initial visit in response to provider referral for pharmacy case management and/or care coordination services. Visit was abbreviated today as patient received another call. Attempted to outreach several times without success. Left VM for him to return my call.  Consent to Services:  The patient was given the following information about Chronic Care Management services today, agreed to services, and gave verbal consent: 1. CCM service includes personalized support from designated clinical staff supervised by the primary care provider, including individualized plan of care and coordination with other care providers 2. 24/7 contact phone numbers for assistance for urgent and routine care needs. 3. Service will only be billed when office clinical staff spend 20 minutes or more in a month to coordinate care. 4. Only one practitioner may furnish and bill the service in a calendar month. 5.The patient may stop CCM services at any time (effective at the end of the month) by phone call to the office staff. 6. The patient will be responsible for cost sharing (co-pay) of up to 20% of the service fee (after annual deductible is met). Patient agreed to services and consent obtained.  Patient Care Team: Glean Hess, MD as PCP - General (Internal Medicine) Ashok Pall, MD as Consulting Physician (Neurosurgery) [facility] (Ophthalmology) Vladimir Faster, [facility] (Pharmacist)  Recent office visits: 08/19/20- berglund(PCP)- blood work, lisinopril 20 mg, microalumin urine 20, BP 144/94  Recent consult visits: None  Hospital visits: None in previous 6  months  Objective:  Lab Results  Component Value Date   CREATININE 0.85 08/19/2020   BUN 14 08/19/2020   GFRNONAA >60 08/19/2020   GFRAA >60 06/09/2018   NA 132 (L) 08/19/2020   K 4.5 08/19/2020   CALCIUM 9.7 08/19/2020   CO2 27 08/19/2020   GLUCOSE 380 (H) 08/19/2020    Lab Results  Component Value Date/Time   HGBA1C 10.9 (H) 08/19/2020 10:59 AM   HGBA1C 7.7 (H) 04/12/2019 02:17 PM   MICROALBUR 20 08/19/2020 10:53 AM    Last diabetic Eye exam:  Lab Results  Component Value Date/Time   HMDIABEYEEXA No Retinopathy 11/23/2017 12:00 AM    Last diabetic Foot exam: No results found for: HMDIABFOOTEX   Lab Results  Component Value Date   CHOL 119 08/19/2020   HDL 40 (L) 08/19/2020   LDLCALC 53 08/19/2020   TRIG 130 08/19/2020   CHOLHDL 3.0 08/19/2020    Hepatic Function Latest Ref Rng & Units 08/19/2020 06/09/2018 08/25/2017  Total Protein 6.5 - 8.1 g/dL 7.7 7.7 7.8  Albumin 3.5 - 5.0 g/dL 4.4 4.5 4.3  AST 15 - 41 U/L 23 17 21   ALT 0 - 44 U/L 27 12 16(L)  Alk Phosphatase 38 - 126 U/L 141(H) 107 111  Total Bilirubin 0.3 - 1.2 mg/dL 1.0 1.1 0.8    No results found for: TSH, FREET4  CBC Latest Ref Rng & Units 08/19/2020 06/09/2018 01/10/2018  WBC 4.0 - 10.5 K/uL 7.6 7.2 8.8  Hemoglobin 13.0 - 17.0 g/dL 17.4(H) 15.7 15.7  Hematocrit 39.0 - 52.0 % 47.8 44.2 44.9  Platelets 150 - 400 K/uL 218 203 230    No  results found for: VD25OH  Clinical ASCVD: No  The ASCVD Risk score Mikey Bussing DC Jr., et al., 2013) failed to calculate for the following reasons:   The valid total cholesterol range is 130 to 320 mg/dL   Unable to determine if patient is Non-Hispanic African American    Depression screen [facility] 2/9 08/19/2020 02/08/2019 10/07/2018  Decreased Interest 0 0 0  Down, Depressed, Hopeless 0 0 0  PHQ - 2 Score 0 0 0  Altered sleeping 0 0 -  Tired, decreased energy 0 0 -  Change in appetite 0 0 -  Feeling bad or failure about yourself  0 0 -  Trouble concentrating 0 0 -  Moving  slowly or fidgety/restless 0 0 -  Suicidal thoughts 0 0 -  PHQ-9 Score 0 0 -  Difficult doing work/chores Not difficult at all - -      Social History   Tobacco Use  Smoking Status Current Every Day Smoker  . Packs/day: 1.00  . Years: 30.00  . Pack years: 30.00  . Types: Cigars, Cigarettes  . Last attempt to quit: 08/11/2017  . Years since quitting: 3.1  Smokeless Tobacco Never Used  Tobacco Comment   3-5 cigars a week   BP Readings from Last 3 Encounters:  08/19/20 (!) 144/94  04/12/19 122/70  02/08/19 106/78   Pulse Readings from Last 3 Encounters:  08/19/20 91  04/12/19 (!) 105  02/08/19 83   Wt Readings from Last 3 Encounters:  08/19/20 134 lb (60.8 kg)  04/12/19 143 lb 3.2 oz (65 kg)  02/08/19 142 lb (64.4 kg)   BMI Readings from Last 3 Encounters:  08/19/20 23.74 kg/m  04/12/19 25.37 kg/m  02/08/19 25.15 kg/m    Assessment/Interventions: Review of patient past medical history, allergies, medications, health status, including review of consultants reports, laboratory and other test data, was performed as part of comprehensive evaluation and provision of chronic care management services.   SDOH:  (Social Determinants of Health) assessments and interventions performed: No  SDOH Screenings   Alcohol Screen: Not on file  Depression (PHQ2-9): Low Risk   . PHQ-2 Score: 0  Financial Resource Strain: Not on file  Food Insecurity: Not on file  Housing: Not on file  Physical Activity: Not on file  Social Connections: Not on file  Stress: Not on file  Tobacco Use: High Risk  . Smoking Tobacco Use: Current Every Day Smoker  . Smokeless Tobacco Use: Never Used  Transportation Needs: Not on file     Immunization History  Administered Date(s) Administered  . Influenza,inj,Quad PF,6+ Mos 06/17/2015, 03/23/2017, 06/08/2018, 02/08/2019  . PPD Test 08/12/2015  . Pneumococcal Polysaccharide-23 06/17/2015  . Tdap 08/08/2015    Conditions to be  addressed/monitored:  Hypertension, Hyperlipidemia and Diabetes and tobacco use  There are no care plans that you recently modified to display for this patient.    Medication Assistance: Application for Farxiga  medication assistance program. in process.  Anticipated assistance start date unknown.  See plan of care for additional detail.  Patient's preferred pharmacy is:  [facility], [place] [address] Phone: [phone] Fax: [phone]  [id number] [place] [address] [place] [place]: [phone]: [phone] Uses pill box? Yes Pt endorses 80 % compliance  We discussed: Current pharmacy is preferred with insurance plan and patient is satisfied with pharmacy services  Patient decided to: Continue current medication management strategy  Care Plan and Follow Up Patient Decision:  Patient agrees to Care Plan and Follow-up.  Plan: Telephone follow up appointment with care management team member scheduled for:  one month  Junita Push. Kenton Kingfisher PharmD, [facility] [phone]

## 2020-10-01 NOTE — Chronic Care Management (AMB) (Signed)
    Chronic Care Management Pharmacy Assistant   Name: Daniel Bell  MRN: [id number] DOB: 1960/11/22  Reason for Encounter: Patient Assistance Documentation    Medications: Outpatient Encounter Medications as of 10/01/2020  Medication Sig Note  . Accu-Chek FastClix Lancets MISC USE AS DIRECTED 4 TIMES A DAY (Patient not taking: No sig reported)   . blood glucose meter kit and supplies KIT Dispense based on patient and insurance preference. Use up to four times daily as directed. (FOR ICD-9 250.00, 250.01). (Patient not taking: No sig reported)   . Blood Glucose Monitoring Suppl (ACCU-CHEK NANO SMARTVIEW) W/DEVICE KIT  (Patient not taking: No sig reported) 11/22/2014: Received from: External Pharmacy Received Sig:   . dapagliflozin propanediol (FARXIGA) 5 MG TABS tablet Take 1 tablet (5 mg total) by mouth daily. Taking samples   . glipiZIDE (GLUCOTROL) 5 MG tablet Take 1 tablet (5 mg total) by mouth 2 (two) times daily before a meal.   . lisinopril (ZESTRIL) 20 MG tablet Take 1 tablet (20 mg total) by mouth daily.   . metFORMIN (GLUCOPHAGE) 1000 MG tablet Take 1 tablet (1,000 mg total) by mouth 2 (two) times daily.   . Multiple Vitamins-Minerals (MULTIVITAMIN WITH MINERALS) tablet Take 1 tablet by mouth daily.   . naproxen (NAPROSYN) 500 MG tablet Take 1 tablet (500 mg total) by mouth 2 (two) times daily. (Patient not taking: No sig reported)   . simvastatin (ZOCOR) 40 MG tablet Take 1 tablet (40 mg total) by mouth daily.    No facility-administered encounter medications on file as of 10/01/2020.   Prepared and reviewed patient assistance application for Farxiga. Spoke with patient and informed him that his patient assistance application for  [place] [place] will be mailed today 10/02/20. He is aware that the application needs to be completed and returned to PCP office.  Wilford Sports CPA, CMA

## 2020-11-01 ENCOUNTER — Telehealth: Payer: Self-pay | Admitting: Pharmacist

## 2020-11-01 NOTE — Chronic Care Management (AMB) (Signed)
    Chronic Care Management Pharmacy Assistant   Name: Rayshon Albaugh  MRN: [id number] DOB: [birth year]  Reason for Encounter: Disease State Diabetes Mellitus   Recent office visits:  None noted  Recent consult visits:  None noted  Hospital visits:  None in previous 6 months  Medications: Outpatient Encounter Medications as of 11/01/2020  Medication Sig Note   Accu-Chek FastClix Lancets MISC USE AS DIRECTED 4 TIMES A DAY (Patient not taking: No sig reported)    blood glucose meter kit and supplies KIT Dispense based on patient and insurance preference. Use up to four times daily as directed. (FOR ICD-9 250.00, 250.01). (Patient not taking: No sig reported)    Blood Glucose Monitoring Suppl (ACCU-CHEK NANO SMARTVIEW) W/DEVICE KIT  (Patient not taking: No sig reported) 11/22/2014: Received from: External Pharmacy Received Sig:    dapagliflozin propanediol (FARXIGA) 5 MG TABS tablet Take 1 tablet (5 mg total) by mouth daily. Taking samples    glipiZIDE (GLUCOTROL) 5 MG tablet Take 1 tablet (5 mg total) by mouth 2 (two) times daily before a meal.    lisinopril (ZESTRIL) 20 MG tablet Take 1 tablet (20 mg total) by mouth daily.    metFORMIN (GLUCOPHAGE) 1000 MG tablet Take 1 tablet (1,000 mg total) by mouth 2 (two) times daily.    Multiple Vitamins-Minerals (MULTIVITAMIN WITH MINERALS) tablet Take 1 tablet by mouth daily.    naproxen (NAPROSYN) 500 MG tablet Take 1 tablet (500 mg total) by mouth 2 (two) times daily. (Patient not taking: No sig reported)    simvastatin (ZOCOR) 40 MG tablet Take 1 tablet (40 mg total) by mouth daily.    No facility-administered encounter medications on file as of 11/01/2020.    Recent Relevant Labs: Lab Results  Component Value Date/Time   HGBA1C 10.9 (H) 08/19/2020 10:59 AM   HGBA1C 7.7 (H) 04/12/2019 02:17 PM   MICROALBUR 20 08/19/2020 10:53 AM    Kidney Function Lab Results  Component Value Date/Time   CREATININE 0.85 08/19/2020 10:59 AM   CREATININE 0.84  06/09/2018 09:05 AM   GFRNONAA >60 08/19/2020 10:59 AM   GFRAA >60 06/09/2018 09:05 AM    Current antihyperglycemic regimen:  Metformin 1000 mg bid  Glipizide 5 bid  farxiga 5 mg  (samples)    Adherence Review: Is the patient currently on a STATIN medication? Yes Is the patient currently on ACE/ARB medication? Yes Does the patient have >5 day gap between last estimated fill dates? No   Unable to reach patient for Diabetes Mellitus Disease State call.   Star Rating Drugs: Glipizide 5 mg Last filled:08/19/2020 90DS Lisinopril 20 mg Last filled:08/19/2020 90 DS Metformin 1000 mg Last filled:08/19/2020 90 DS Simvastatin 40 mg Last filled:08/19/2020 90 DS Farxiga 5 mg Last filled:09/12/2020 90 DS   Myriam Elta [place], [place]

## 2020-11-11 ENCOUNTER — Other Ambulatory Visit: Payer: Self-pay

## 2020-11-11 ENCOUNTER — Encounter: Payer: Self-pay | Admitting: Internal Medicine

## 2020-11-11 ENCOUNTER — Ambulatory Visit (INDEPENDENT_AMBULATORY_CARE_PROVIDER_SITE_OTHER): Payer: BC Managed Care – PPO | Admitting: Internal Medicine

## 2020-11-11 VITALS — BP 106/72 | HR 98 | Temp 98.2°F | Ht 63.0 in | Wt 132.0 lb

## 2020-11-11 DIAGNOSIS — E118 Type 2 diabetes mellitus with unspecified complications: Secondary | ICD-10-CM | POA: Diagnosis not present

## 2020-11-11 DIAGNOSIS — I10 Essential (primary) hypertension: Secondary | ICD-10-CM

## 2020-11-11 DIAGNOSIS — M25551 Pain in right hip: Secondary | ICD-10-CM

## 2020-11-11 LAB — POCT GLYCOSYLATED HEMOGLOBIN (HGB A1C): Hemoglobin A1C: 8.4 % — AB (ref 4.0–5.6)

## 2020-11-11 MED ORDER — DAPAGLIFLOZIN PROPANEDIOL 5 MG PO TABS: 5.0000 mg | ORAL_TABLET | Freq: Every day | ORAL | 0 refills | Status: DC

## 2020-11-11 MED ORDER — MELOXICAM 15 MG PO TABS
15.0000 mg | ORAL_TABLET | Freq: Every day | ORAL | 1 refills | Status: DC | PRN
Start: 2020-11-11 — End: 2021-01-17

## 2020-11-11 MED ORDER — CYCLOBENZAPRINE HCL 5 MG PO TABS: 5.0000 mg | ORAL_TABLET | Freq: Three times a day (TID) | ORAL | 1 refills | Status: DC | PRN

## 2020-11-11 NOTE — Patient Instructions (Signed)
Schedule diabetic eye exam

## 2020-11-11 NOTE — Progress Notes (Signed)
Date:  11/11/2020   Name:  Daniel Bell   DOB:  07-Oct-1960   MRN:  [id number]   Chief Complaint: Diabetes and Hip Pain (Went to UC in 2017, left side )  Diabetes He presents for his follow-up diabetic visit. He has type 2 diabetes mellitus. His disease course has been improving. Pertinent negatives for hypoglycemia include no dizziness, headaches or nervousness/anxiousness. Pertinent negatives for diabetes include no chest pain, no fatigue and no weakness. Current diabetic treatment includes oral agent (triple therapy) (metformin, glipizide, farxiga). He is compliant with treatment all of the time. His weight is stable. There is no compliance with monitoring of blood glucose. An ACE inhibitor/angiotensin II receptor blocker is being taken.  Hip Pain  The pain is present in the right hip. The quality of the pain is described as aching and cramping. The pain is moderate. The pain has been Intermittent since onset. He has tried NSAIDs (does best with mobic and PRN flexeril) for the symptoms.  Hypertension This is a chronic problem. The problem is controlled. Pertinent negatives include no chest pain, headaches, palpitations or shortness of breath. Past treatments include ACE inhibitors. The current treatment provides significant improvement.   Lab Results  Component Value Date   CREATININE 0.85 08/19/2020   BUN 14 08/19/2020   NA 132 (L) 08/19/2020   K 4.5 08/19/2020   CL 96 (L) 08/19/2020   CO2 27 08/19/2020   Lab Results  Component Value Date   CHOL 119 08/19/2020   HDL 40 (L) 08/19/2020   LDLCALC 53 08/19/2020   TRIG 130 08/19/2020   CHOLHDL 3.0 08/19/2020   No results found for: TSH Lab Results  Component Value Date   HGBA1C 10.9 (H) 08/19/2020   Lab Results  Component Value Date   WBC 7.6 08/19/2020   HGB 17.4 (H) 08/19/2020   HCT 47.8 08/19/2020   MCV 85.7 08/19/2020   PLT 218 08/19/2020   Lab Results  Component Value Date   ALT 27 08/19/2020   AST 23 08/19/2020    ALKPHOS 141 (H) 08/19/2020   BILITOT 1.0 08/19/2020     Review of Systems  Constitutional:  Negative for appetite change, chills, fatigue and unexpected weight change.  HENT:  Negative for nosebleeds.   Eyes:  Negative for visual disturbance.  Respiratory:  Negative for cough, chest tightness, shortness of breath and wheezing.   Cardiovascular:  Negative for chest pain, palpitations and leg swelling.  Gastrointestinal:  Negative for abdominal pain, constipation and diarrhea.  Musculoskeletal:  Positive for arthralgias and gait problem.  Neurological:  Negative for dizziness, weakness, light-headedness and headaches.  Psychiatric/Behavioral:  Negative for dysphoric mood and sleep disturbance. The patient is not nervous/anxious.    Patient Active Problem List   Diagnosis Date Noted   Encounter for screening colonoscopy    Pain of right hip joint 08/25/2017   Hematuria, microscopic 08/25/2017   Cervical stenosis of spinal canal 12/10/2015   Neural foraminal stenosis of cervical spine 12/10/2015   Cervical radiculopathy 11/22/2015   Type II diabetes mellitus with complication ([place]) 94/76/5465   Essential (primary) hypertension 11/22/2014   Glaucoma 11/22/2014   Bilateral hearing loss 11/22/2014   Hyperlipidemia associated with type 2 diabetes mellitus ([place]) 11/22/2014    Allergies  Allergen Reactions   Trulicity [Dulaglutide] Diarrhea    Past Surgical History:  Procedure Laterality Date   COLONOSCOPY WITH PROPOFOL N/A 07/04/2018   Procedure: COLONOSCOPY WITH PROPOFOL;  Surgeon: Lin Landsman, MD;  [facility]  ENDOSCOPY;  Service: Gastroenterology;  Laterality: N/A;   HIP SURGERY Right    as a baby for leg length discrepancy    Social History   Tobacco Use   Smoking status: Every Day    Packs/day: 1.00    Years: 30.00    Pack years: 30.00    Types: Cigars, Cigarettes    Last attempt to quit: 08/11/2017    Years since quitting: 3.2   Smokeless tobacco: Never    Tobacco comments:    3-5 cigars a week  Vaping Use   Vaping Use: Never used  Substance Use Topics   Alcohol use: No    Alcohol/week: 0.0 standard drinks   Drug use: No     Medication list has been reviewed and updated.  Current Meds  Medication Sig   dapagliflozin propanediol (FARXIGA) 5 MG TABS tablet Take 1 tablet (5 mg total) by mouth daily. Taking samples   glipiZIDE (GLUCOTROL) 5 MG tablet Take 1 tablet (5 mg total) by mouth 2 (two) times daily before a meal.   lisinopril (ZESTRIL) 20 MG tablet Take 1 tablet (20 mg total) by mouth daily.   meloxicam (MOBIC) 15 MG tablet Take 15 mg by mouth daily.   metFORMIN (GLUCOPHAGE) 1000 MG tablet Take 1 tablet (1,000 mg total) by mouth 2 (two) times daily.   Multiple Vitamins-Minerals (MULTIVITAMIN WITH MINERALS) tablet Take 1 tablet by mouth daily.   naproxen (NAPROSYN) 500 MG tablet Take 1 tablet (500 mg total) by mouth 2 (two) times daily.   simvastatin (ZOCOR) 40 MG tablet Take 1 tablet (40 mg total) by mouth daily.    PHQ 2/9 Scores 11/11/2020 08/19/2020 02/08/2019 10/07/2018  PHQ - 2 Score 0 0 0 0  PHQ- 9 Score 0 0 0 -    GAD 7 : Generalized Anxiety Score 11/11/2020 08/19/2020  Nervous, Anxious, on Edge 0 0  Control/stop worrying 0 0  Worry too much - different things 0 0  Trouble relaxing 0 0  Restless 0 0  Easily annoyed or irritable 0 0  Afraid - awful might happen 0 0  Total GAD 7 Score 0 0  Anxiety Difficulty - Not difficult at all    BP Readings from Last 3 Encounters:  11/11/20 106/72  08/19/20 (!) 144/94  04/12/19 122/70    Physical Exam Vitals and nursing note reviewed.  Constitutional:      General: He is not in acute distress.    Appearance: He is well-developed.  HENT:     Head: Normocephalic and atraumatic.  Cardiovascular:     Rate and Rhythm: Normal rate and regular rhythm.     Pulses: Normal pulses.     Heart sounds: No murmur heard. Pulmonary:     Effort: Pulmonary effort is normal. No respiratory  distress.     Breath sounds: No wheezing or rhonchi.  Musculoskeletal:     Right hip: Tenderness present. Decreased range of motion.  Skin:    General: Skin is warm and dry.     Findings: No rash.  Neurological:     Mental Status: He is alert and oriented to person, place, and time.  Psychiatric:        Mood and Affect: Mood normal.        Behavior: Behavior normal.    Wt Readings from Last 3 Encounters:  11/11/20 132 lb (59.9 kg)  08/19/20 134 lb (60.8 kg)  04/12/19 143 lb 3.2 oz (65 kg)    BP 106/72   Pulse  98   Temp 98.2 F (36.8 C) (Oral)   Ht 5\' 3"  (1.6 m)   Wt 132 lb (59.9 kg)   SpO2 98%   BMI 23.38 kg/m   Assessment and Plan: 1. Essential (primary) hypertension Clinically stable exam with well controlled BP. Tolerating medications without side effects at this time. Pt to continue current regimen and low sodium diet; benefits of regular exercise as able discussed.  2. Type II diabetes mellitus with complication (HCC) BS much improved with resumption of Farxiga 5 mg. Pt assistance completed and will be faxed. Weight has stabilized Needs to schedule Eye exam - dapagliflozin propanediol (FARXIGA) 5 MG TABS tablet; Take 1 tablet (5 mg total) by mouth daily. Taking samples  Dispense: 90 tablet; Refill: 0 - POCT HgB A1C = 8.4 down from 10.9  3. Pain of right hip joint Continue Mobic - meds refilled - meloxicam (MOBIC) 15 MG tablet; Take 1 tablet (15 mg total) by mouth daily as needed for pain.  Dispense: 30 tablet; Refill: 1 - cyclobenzaprine (FLEXERIL) 5 MG tablet; Take 1 tablet (5 mg total) by mouth 3 (three) times daily as needed for muscle spasms.  Dispense: 30 tablet; Refill: 1   Partially dictated using Editor, commissioning. Any errors are unintentional.  [name], MD [facility]  11/11/2020

## 2020-11-12 ENCOUNTER — Telehealth: Payer: Self-pay | Admitting: Pharmacist

## 2020-11-12 NOTE — Telephone Encounter (Signed)
Farxiga patient assistance application faxed to [facility] to follow up in 2 weeks.

## 2020-11-27 ENCOUNTER — Telehealth: Payer: Self-pay | Admitting: Pharmacist

## 2020-11-27 NOTE — Progress Notes (Signed)
    Chronic Care Management Pharmacy Assistant   Name: Daniel Bell  MRN: [id number] DOB: 03-Mar-1961  Reason for Encounter: Chart Review    Medications: Outpatient Encounter Medications as of 11/27/2020  Medication Sig Note   Accu-Chek FastClix Lancets MISC USE AS DIRECTED 4 TIMES A DAY (Patient not taking: No sig reported)    blood glucose meter kit and supplies KIT Dispense based on patient and insurance preference. Use up to four times daily as directed. (FOR ICD-9 250.00, 250.01). (Patient not taking: No sig reported)    Blood Glucose Monitoring Suppl (ACCU-CHEK NANO SMARTVIEW) W/DEVICE KIT  (Patient not taking: No sig reported) 11/22/2014: Received from: External Pharmacy Received Sig:    cyclobenzaprine (FLEXERIL) 5 MG tablet Take 1 tablet (5 mg total) by mouth 3 (three) times daily as needed for muscle spasms.    dapagliflozin propanediol (FARXIGA) 5 MG TABS tablet Take 1 tablet (5 mg total) by mouth daily. Taking samples    glipiZIDE (GLUCOTROL) 5 MG tablet Take 1 tablet (5 mg total) by mouth 2 (two) times daily before a meal.    lisinopril (ZESTRIL) 20 MG tablet Take 1 tablet (20 mg total) by mouth daily.    meloxicam (MOBIC) 15 MG tablet Take 1 tablet (15 mg total) by mouth daily as needed for pain.    metFORMIN (GLUCOPHAGE) 1000 MG tablet Take 1 tablet (1,000 mg total) by mouth 2 (two) times daily.    Multiple Vitamins-Minerals (MULTIVITAMIN WITH MINERALS) tablet Take 1 tablet by mouth daily.    simvastatin (ZOCOR) 40 MG tablet Take 1 tablet (40 mg total) by mouth daily.    No facility-administered encounter medications on file as of 11/27/2020.    Reviewed chart for medication changes and adherence.  Recent OV, Consult or Hospital visit: 11/11/20 [name] (PCP) General follow up. No medication changes indicated  No gaps in adherence identified. Patient has follow up scheduled with pharmacy team. No further action required.  [name], [name]

## 2021-01-16 ENCOUNTER — Other Ambulatory Visit: Payer: Self-pay | Admitting: Internal Medicine

## 2021-01-16 DIAGNOSIS — M25551 Pain in right hip: Secondary | ICD-10-CM

## 2021-01-16 NOTE — Telephone Encounter (Signed)
Requested medications are due for refill today yes  Requested medications are on the active medication list yes  Last refill 7/16  Last visit 6/13  Future visit scheduled yes, 10/17  Notes to clinic Pharm requesting dx code.

## 2021-02-12 ENCOUNTER — Other Ambulatory Visit: Payer: Self-pay | Admitting: Internal Medicine

## 2021-02-12 DIAGNOSIS — E118 Type 2 diabetes mellitus with unspecified complications: Secondary | ICD-10-CM

## 2021-02-12 DIAGNOSIS — E1169 Type 2 diabetes mellitus with other specified complication: Secondary | ICD-10-CM

## 2021-03-08 ENCOUNTER — Other Ambulatory Visit: Payer: Self-pay | Admitting: Internal Medicine

## 2021-03-08 DIAGNOSIS — E118 Type 2 diabetes mellitus with unspecified complications: Secondary | ICD-10-CM

## 2021-03-08 NOTE — Telephone Encounter (Signed)
Requested medication (s) are due for refill today: yes  Requested medication (s) are on the active medication list: yes  Last refill:  11/11/20 #90  Future visit scheduled: yes  Notes to clinic:  overdue lab work Hgb A1C   Requested Prescriptions  Pending Prescriptions Disp Refills   FARXIGA 5 MG TABS tablet [Pharmacy Med Name: FARXIGA 5 MG TABLET] 90 tablet 0    Sig: TAKE 1 TABLET (5 MG TOTAL) BY MOUTH DAILY. TAKING SAMPLES     Endocrinology:  Diabetes - SGLT2 Inhibitors Failed - 03/08/2021  9:08 AM      Failed - HBA1C is between 0 and 7.9 and within 180 days    Hemoglobin A1C  Date Value Ref Range Status  11/11/2020 8.4 (A) 4.0 - 5.6 % Final   Hgb A1c MFr Bld  Date Value Ref Range Status  08/19/2020 10.9 (H) 4.8 - 5.6 % Final    Comment:    (NOTE) Pre diabetes:          5.7%-6.4%  Diabetes:              >6.4%  Glycemic control for   <7.0% adults with diabetes           Failed - AA eGFR in normal range and within 360 days    GFR calc Af Amer  Date Value Ref Range Status  06/09/2018 >60 >60 mL/min Final   GFR, Estimated  Date Value Ref Range Status  08/19/2020 >60 >60 mL/min Final    Comment:    (NOTE) Calculated using the CKD-EPI Creatinine Equation (2021)           Passed - Cr in normal range and within 360 days    Creatinine, Ser  Date Value Ref Range Status  08/19/2020 0.85 0.61 - 1.24 mg/dL Final          Passed - LDL in normal range and within 360 days    LDL Calculated  Date Value Ref Range Status  10/16/2015 41 0 - 99 mg/dL Final   LDL Cholesterol  Date Value Ref Range Status  08/19/2020 53 0 - 99 mg/dL Final    Comment:           Total Cholesterol/HDL:CHD Risk Coronary Heart Disease Risk Table                     Men   Women  1/2 Average Risk   3.4   3.3  Average Risk       5.0   4.4  2 X Average Risk   9.6   7.1  3 X Average Risk  23.4   11.0        Use the calculated Patient Ratio above and the CHD Risk Table to determine the  patient's CHD Risk.        ATP III CLASSIFICATION (LDL):  <100     mg/dL   Optimal  100-129  mg/dL   Near or Above                    Optimal  130-159  mg/dL   Borderline  160-189  mg/dL   High  >190     mg/dL   Very High Performed at [facility], [address] [place], [place] [address]           Passed - Valid encounter within last 6 months    Recent Outpatient Visits             3 months ago Essential (primary) hypertension   [facility] Glean Hess, MD   6 months ago Essential (primary) hypertension   [facility] Glean Hess, MD   1 year ago Type II diabetes mellitus with complication [facility])   [facility] Glean Hess, MD   2 years ago Type II diabetes mellitus with complication [facility])   [facility] Glean Hess, MD   2 years ago Diabetes mellitus type 2, uncontrolled, without complications [facility])   [facility] Glean Hess, MD       Future Appointments             In 6 days Glean Hess, MD [facility]

## 2021-03-14 ENCOUNTER — Ambulatory Visit: Payer: BC Managed Care – PPO | Admitting: Internal Medicine

## 2021-03-14 NOTE — Progress Notes (Deleted)
Date:  03/14/2021   Name:  Daniel Bell   DOB:  1961/03/19   MRN:  [id number]   Chief Complaint: No chief complaint on file.  Diabetes He presents for his follow-up diabetic visit. He has type 2 diabetes mellitus. Pertinent negatives for hypoglycemia include no headaches or tremors. Pertinent negatives for diabetes include no chest pain, no fatigue, no polydipsia and no polyuria. Current diabetic treatment includes oral agent (triple therapy) (metformin, farxiga, glipizide).  Hypertension This is a chronic problem. The problem is controlled. Pertinent negatives include no chest pain, headaches, palpitations or shortness of breath. Past treatments include ACE inhibitors. The current treatment provides significant improvement.   Lab Results  Component Value Date   CREATININE 0.85 08/19/2020   BUN 14 08/19/2020   NA 132 (L) 08/19/2020   K 4.5 08/19/2020   CL 96 (L) 08/19/2020   CO2 27 08/19/2020   Lab Results  Component Value Date   CHOL 119 08/19/2020   HDL 40 (L) 08/19/2020   LDLCALC 53 08/19/2020   TRIG 130 08/19/2020   CHOLHDL 3.0 08/19/2020   No results found for: TSH Lab Results  Component Value Date   HGBA1C 8.4 (A) 11/11/2020   Lab Results  Component Value Date   WBC 7.6 08/19/2020   HGB 17.4 (H) 08/19/2020   HCT 47.8 08/19/2020   MCV 85.7 08/19/2020   PLT 218 08/19/2020   Lab Results  Component Value Date   ALT 27 08/19/2020   AST 23 08/19/2020   ALKPHOS 141 (H) 08/19/2020   BILITOT 1.0 08/19/2020     Review of Systems  Constitutional:  Negative for appetite change, fatigue and unexpected weight change.  Eyes:  Negative for visual disturbance.  Respiratory:  Negative for cough, shortness of breath and wheezing.   Cardiovascular:  Negative for chest pain, palpitations and leg swelling.  Gastrointestinal:  Negative for abdominal pain and blood in stool.  Endocrine: Negative for polydipsia and polyuria.  Genitourinary:  Negative for dysuria and hematuria.   Skin:  Negative for color change and rash.  Neurological:  Negative for tremors, numbness and headaches.  Psychiatric/Behavioral:  Negative for dysphoric mood.    Patient Active Problem List   Diagnosis Date Noted   Encounter for screening colonoscopy    Pain of right hip joint 08/25/2017   Hematuria, microscopic 08/25/2017   Cervical stenosis of spinal canal 12/10/2015   Neural foraminal stenosis of cervical spine 12/10/2015   Cervical radiculopathy 11/22/2015   Type II diabetes mellitus with complication ([place]) 73/41/9379   Essential (primary) hypertension 11/22/2014   Glaucoma 11/22/2014   Bilateral hearing loss 11/22/2014   Hyperlipidemia associated with type 2 diabetes mellitus ([place]) 11/22/2014    Allergies  Allergen Reactions   Trulicity [Dulaglutide] Diarrhea    Past Surgical History:  Procedure Laterality Date   COLONOSCOPY WITH PROPOFOL N/A 07/04/2018   Procedure: COLONOSCOPY WITH PROPOFOL;  Surgeon: Lin Landsman, MD;  Location: [facility] ENDOSCOPY;  Service: Gastroenterology;  Laterality: N/A;   HIP SURGERY Right    as a baby for leg length discrepancy    Social History   Tobacco Use   Smoking status: Every Day    Packs/day: 1.00    Years: 30.00    Pack years: 30.00    Types: Cigars, Cigarettes    Last attempt to quit: 08/11/2017    Years since quitting: 3.5   Smokeless tobacco: Never   Tobacco comments:    3-5 cigars a week  Vaping Use  Vaping Use: Never used  Substance Use Topics   Alcohol use: No    Alcohol/week: 0.0 standard drinks   Drug use: No     Medication list has been reviewed and updated.  No outpatient medications have been marked as taking for the 03/14/21 encounter (Appointment) with Glean Hess, MD.    [facility] 2/9 Scores 11/11/2020 08/19/2020 02/08/2019 10/07/2018  PHQ - 2 Score 0 0 0 0  PHQ- 9 Score 0 0 0 -    GAD 7 : Generalized Anxiety Score 11/11/2020 08/19/2020  Nervous, Anxious, on Edge 0 0  Control/stop worrying 0 0  Worry  too much - different things 0 0  Trouble relaxing 0 0  Restless 0 0  Easily annoyed or irritable 0 0  Afraid - awful might happen 0 0  Total GAD 7 Score 0 0  Anxiety Difficulty - Not difficult at all    BP Readings from Last 3 Encounters:  11/11/20 106/72  08/19/20 (!) 144/94  04/12/19 122/70    Physical Exam Vitals and nursing note reviewed.  Constitutional:      General: He is not in acute distress.    Appearance: He is well-developed.  HENT:     Head: Normocephalic and atraumatic.  Pulmonary:     Effort: Pulmonary effort is normal. No respiratory distress.  Skin:    General: Skin is warm and dry.     Findings: No rash.  Neurological:     Mental Status: He is alert and oriented to person, place, and time.  Psychiatric:        Mood and Affect: Mood normal.        Behavior: Behavior normal.    Wt Readings from Last 3 Encounters:  11/11/20 132 lb (59.9 kg)  08/19/20 134 lb (60.8 kg)  04/12/19 143 lb 3.2 oz (65 kg)    There were no vitals taken for this visit.  Assessment and Plan:

## 2021-03-17 ENCOUNTER — Ambulatory Visit: Payer: BC Managed Care – PPO | Admitting: Internal Medicine

## 2021-04-23 ENCOUNTER — Encounter: Payer: Self-pay | Admitting: Internal Medicine

## 2021-04-23 ENCOUNTER — Other Ambulatory Visit: Payer: Self-pay

## 2021-04-23 ENCOUNTER — Other Ambulatory Visit
Admission: RE | Admit: 2021-04-23 | Discharge: 2021-04-23 | Disposition: A | Discharge status: Home / Self Care | Payer: BC Managed Care – PPO | Attending: Internal Medicine | Admitting: Internal Medicine

## 2021-04-23 ENCOUNTER — Ambulatory Visit (INDEPENDENT_AMBULATORY_CARE_PROVIDER_SITE_OTHER): Payer: BC Managed Care – PPO | Admitting: Internal Medicine

## 2021-04-23 VITALS — BP 128/84 | HR 111 | Ht 63.0 in | Wt 121.0 lb

## 2021-04-23 DIAGNOSIS — E785 Hyperlipidemia, unspecified: Secondary | ICD-10-CM | POA: Insufficient documentation

## 2021-04-23 DIAGNOSIS — E1169 Type 2 diabetes mellitus with other specified complication: Secondary | ICD-10-CM

## 2021-04-23 DIAGNOSIS — I10 Essential (primary) hypertension: Secondary | ICD-10-CM | POA: Insufficient documentation

## 2021-04-23 DIAGNOSIS — Z23 Encounter for immunization: Secondary | ICD-10-CM

## 2021-04-23 DIAGNOSIS — E118 Type 2 diabetes mellitus with unspecified complications: Secondary | ICD-10-CM

## 2021-04-23 LAB — HEMOGLOBIN A1C
Hgb A1c MFr Bld: 8 % — ABNORMAL HIGH (ref 4.8–5.6)
Mean Plasma Glucose: 182.9 mg/dL

## 2021-04-23 LAB — LIPID PANEL
Cholesterol: 122 mg/dL (ref 0–200)
HDL: 49 mg/dL (ref >40)
LDL Cholesterol: 60 mg/dL (ref 0–99)
Total CHOL/HDL Ratio: 2.5 RATIO
Triglycerides: 65 mg/dL (ref <150)
VLDL: 13 mg/dL (ref 0–40)

## 2021-04-23 LAB — BASIC METABOLIC PANEL
Anion gap: 12 (ref 5–15)
BUN: 25 mg/dL — ABNORMAL HIGH (ref 6–20)
CO2: 23 mmol/L (ref 22–32)
Calcium: 9.6 mg/dL (ref 8.9–10.3)
Chloride: 97 mmol/L — ABNORMAL LOW (ref 98–111)
Creatinine, Ser: 1.03 mg/dL (ref 0.61–1.24)
GFR, Estimated: >60 mL/min (ref >60)
Glucose, Bld: 191 mg/dL — ABNORMAL HIGH (ref 70–99)
Potassium: 4.2 mmol/L (ref 3.5–5.1)
Sodium: 132 mmol/L — ABNORMAL LOW (ref 135–145)

## 2021-04-23 MED ORDER — METFORMIN HCL 1000 MG PO TABS: 1000.0000 mg | ORAL_TABLET | Freq: Two times a day (BID) | ORAL | 1 refills | Status: DC

## 2021-04-23 MED ORDER — DAPAGLIFLOZIN PROPANEDIOL 5 MG PO TABS: ORAL_TABLET | ORAL | 1 refills | Status: DC

## 2021-04-23 MED ORDER — GLIPIZIDE 5 MG PO TABS: 5.0000 mg | ORAL_TABLET | Freq: Two times a day (BID) | ORAL | 1 refills | Status: DC

## 2021-04-23 NOTE — Progress Notes (Signed)
Date:  04/23/2021   Name:  Daniel Bell   DOB:  [birth year]   MRN:  [id number]   Chief Complaint: Diabetes and Hypertension He recently lost his wife and is struggling some.  He has good days and bad days. He is sleeping fairly well.  Eating 2 meals a day but has lost about 15 lbs.  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  Diabetes He presents for his follow-up diabetic visit. He has type 2 diabetes mellitus. His disease course has been stable. Hypoglycemia symptoms include nervousness/anxiousness. Pertinent negatives for hypoglycemia include no headaches or tremors. Pertinent negatives for diabetes include no chest pain, no fatigue, no polydipsia and no polyuria. Pertinent negatives for diabetic complications include no CVA. Current diabetic treatment includes oral agent (triple therapy) (metformin, farxiga, glipizide). He is compliant with treatment all of the time.  Hypertension This is a chronic problem. The problem is controlled. Pertinent negatives include no chest pain, headaches, palpitations or shortness of breath. Past treatments include ACE inhibitors. The current treatment provides significant improvement. There are no compliance problems.  There is no history of kidney disease, CAD/MI or CVA.  Hyperlipidemia This is a chronic problem. The problem is controlled (LDL 53). Pertinent negatives include no chest pain or shortness of breath. Current antihyperlipidemic treatment includes statins.   Lab Results  Component Value Date   CREATININE 0.85 08/19/2020   BUN 14 08/19/2020   NA 132 (L) 08/19/2020   K 4.5 08/19/2020   CL 96 (L) 08/19/2020   CO2 27 08/19/2020   Lab Results  Component Value Date   CHOL 119 08/19/2020   HDL 40 (L) 08/19/2020   LDLCALC 53 08/19/2020   TRIG 130 08/19/2020   CHOLHDL 3.0 08/19/2020   No results found for: TSH Lab Results  Component Value Date   HGBA1C 8.4 (A) 11/11/2020   Lab Results  Component Value Date   WBC 7.6 08/19/2020    HGB 17.4 (H) 08/19/2020   HCT 47.8 08/19/2020   MCV 85.7 08/19/2020   PLT 218 08/19/2020   Lab Results  Component Value Date   ALT 27 08/19/2020   AST 23 08/19/2020   ALKPHOS 141 (H) 08/19/2020   BILITOT 1.0 08/19/2020   No components found for: VITD  Review of Systems  Constitutional:  Negative for appetite change, fatigue and unexpected weight change.  Eyes:  Negative for visual disturbance.  Respiratory:  Negative for cough, shortness of breath and wheezing.   Cardiovascular:  Negative for chest pain, palpitations and leg swelling.  Gastrointestinal:  Negative for abdominal pain and blood in stool.  Endocrine: Negative for polydipsia and polyuria.  Genitourinary:  Negative for dysuria and hematuria.  Skin:  Negative for color change and rash.  Neurological:  Negative for tremors, numbness and headaches.  Psychiatric/Behavioral:  Positive for dysphoric mood. Negative for sleep disturbance. The patient is nervous/anxious.    Patient Active Problem List   Diagnosis Date Noted   Encounter for screening colonoscopy    Pain of right hip joint 08/25/2017   Hematuria, microscopic 08/25/2017   Cervical stenosis of spinal canal 12/10/2015   Neural foraminal stenosis of cervical spine 12/10/2015   Cervical radiculopathy 11/22/2015   Type II diabetes mellitus with complication ([place]) 76/28/3151   Essential (primary) hypertension 11/22/2014   Glaucoma 11/22/2014   Bilateral hearing loss 11/22/2014   Hyperlipidemia associated with type 2 diabetes mellitus ([place]) 11/22/2014    Allergies  Allergen Reactions   Trulicity [Dulaglutide]  Diarrhea    Past Surgical History:  Procedure Laterality Date   COLONOSCOPY WITH PROPOFOL N/A 07/04/2018   Procedure: COLONOSCOPY WITH PROPOFOL;  Surgeon: Lin Landsman, MD;  Location: [facility] ENDOSCOPY;  Service: Gastroenterology;  Laterality: N/A;   HIP SURGERY Right    as a baby for leg length discrepancy    Social History   Tobacco Use    Smoking status: Every Day    Packs/day: 1.00    Years: 30.00    Pack years: 30.00    Types: Cigars, Cigarettes    Last attempt to quit: 08/11/2017    Years since quitting: 3.7   Smokeless tobacco: Never   Tobacco comments:    3-5 cigars a week  Vaping Use   Vaping Use: Never used  Substance Use Topics   Alcohol use: No    Alcohol/week: 0.0 standard drinks   Drug use: No     Medication list has been reviewed and updated.  Current Meds  Medication Sig   Accu-Chek FastClix Lancets MISC USE AS DIRECTED 4 TIMES A DAY   blood glucose meter kit and supplies KIT Dispense based on patient and insurance preference. Use up to four times daily as directed. (FOR ICD-9 250.00, 250.01).   Blood Glucose Monitoring Suppl (ACCU-CHEK NANO SMARTVIEW) W/DEVICE KIT    cyclobenzaprine (FLEXERIL) 5 MG tablet Take 1 tablet (5 mg total) by mouth 3 (three) times daily as needed for muscle spasms.   lisinopril (ZESTRIL) 20 MG tablet Take 1 tablet (20 mg total) by mouth daily.   meloxicam (MOBIC) 15 MG tablet TAKE 1 TABLET BY MOUTH EVERY DAY AS NEEDED FOR PAIN   Multiple Vitamins-Minerals (MULTIVITAMIN WITH MINERALS) tablet Take 1 tablet by mouth daily.   simvastatin (ZOCOR) 40 MG tablet TAKE 1 TABLET BY MOUTH EVERY DAY   [DISCONTINUED] FARXIGA 5 MG TABS tablet TAKE 1 TABLET (5 MG TOTAL) BY MOUTH DAILY. TAKING SAMPLES   [DISCONTINUED] glipiZIDE (GLUCOTROL) 5 MG tablet TAKE 1 TABLET (5 MG TOTAL) BY MOUTH 2 (TWO) TIMES DAILY BEFORE A MEAL.   [DISCONTINUED] metFORMIN (GLUCOPHAGE) 1000 MG tablet TAKE 1 TABLET BY MOUTH TWICE A DAY    PHQ 2/9 Scores 04/23/2021 11/11/2020 08/19/2020 02/08/2019  PHQ - 2 Score 2 0 0 0  PHQ- 9 Score 2 0 0 0    GAD 7 : Generalized Anxiety Score 04/23/2021 11/11/2020 08/19/2020  Nervous, Anxious, on Edge 0 0 0  Control/stop worrying 0 0 0  Worry too much - different things 2 0 0  Trouble relaxing 0 0 0  Restless 0 0 0  Easily annoyed or irritable 0 0 0  Afraid - awful might happen 2  0 0  Total GAD 7 Score 4 0 0  Anxiety Difficulty Somewhat difficult - Not difficult at all    BP Readings from Last 3 Encounters:  04/23/21 128/84  11/11/20 106/72  08/19/20 (!) 144/94    Physical Exam Vitals and nursing note reviewed.  Constitutional:      General: He is not in acute distress.    Appearance: He is well-developed.  HENT:     Head: Normocephalic and atraumatic.  Cardiovascular:     Rate and Rhythm: Normal rate and regular rhythm.     Pulses: Normal pulses.     Heart sounds: No murmur heard. Pulmonary:     Effort: Pulmonary effort is normal. No respiratory distress.     Breath sounds: No wheezing or rhonchi.  Musculoskeletal:  General: Deformity present.     Right lower leg: No edema.     Left lower leg: No edema.  Lymphadenopathy:     Cervical: No cervical adenopathy.  Skin:    General: Skin is warm and dry.     Findings: No rash.  Neurological:     General: No focal deficit present.     Mental Status: He is alert and oriented to person, place, and time.  Psychiatric:        Attention and Perception: Attention normal.        Mood and Affect: Mood normal. Affect is tearful.        Speech: Speech normal.        Behavior: Behavior normal.        Judgment: Judgment normal.    Wt Readings from Last 3 Encounters:  04/23/21 121 lb (54.9 kg)  11/11/20 132 lb (59.9 kg)  08/19/20 134 lb (60.8 kg)    BP 128/84   Pulse (!) 111   Ht 5' 3"  (1.6 m)   Wt 121 lb (54.9 kg)   SpO2 98%   BMI 21.43 kg/m   Assessment and Plan: 1. Type II diabetes mellitus with complication (HCC) Clinically stable by exam and report without s/s of hypoglycemia. DM complicated by hypertension and dyslipidemia. Tolerating medications well without side effects or other concerns. 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 Encouraged him to schedule Eye exam. - Hemoglobin A1c - glipiZIDE (GLUCOTROL) 5 MG tablet; Take 1 tablet (5 mg total) by mouth 2 (two) times daily before a meal.   Dispense: 180 tablet; Refill: 1 - metFORMIN (GLUCOPHAGE) 1000 MG tablet; Take 1 tablet (1,000 mg total) by mouth 2 (two) times daily.  Dispense: 180 tablet; Refill: 1 - dapagliflozin propanediol (FARXIGA) 5 MG TABS tablet; TAKE 1 TABLET (5 MG TOTAL) BY MOUTH DAILY. TAKING SAMPLES  Dispense: 90 tablet; Refill: 1  2. Essential (primary) hypertension Clinically stable exam with well controlled BP. Tolerating medications without side effects at this time. Pt to continue current regimen and low sodium diet; benefits of regular exercise as able discussed. - Basic metabolic panel  3. Hyperlipidemia associated with type 2 diabetes mellitus ([place]) Inconsistent in taking Zocor.  He will try to be better. - Lipid panel   Partially dictated using Editor, commissioning. Any errors are unintentional.  [name], MD [facility]  04/23/2021

## 2021-04-29 ENCOUNTER — Telehealth: Payer: Self-pay

## 2021-04-29 NOTE — Telephone Encounter (Signed)
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  [facility] nurse may give results to patient if they return call to clinic, a CRM has been created.  KP

## 2021-08-25 ENCOUNTER — Ambulatory Visit (INDEPENDENT_AMBULATORY_CARE_PROVIDER_SITE_OTHER): Payer: BC Managed Care – PPO | Admitting: Internal Medicine

## 2021-08-25 ENCOUNTER — Encounter: Payer: Self-pay | Admitting: Internal Medicine

## 2021-08-25 ENCOUNTER — Other Ambulatory Visit: Payer: Self-pay

## 2021-08-25 VITALS — BP 148/82 | HR 100 | Ht 63.0 in | Wt 126.4 lb

## 2021-08-25 DIAGNOSIS — I10 Essential (primary) hypertension: Secondary | ICD-10-CM | POA: Diagnosis not present

## 2021-08-25 DIAGNOSIS — E1169 Type 2 diabetes mellitus with other specified complication: Secondary | ICD-10-CM | POA: Diagnosis not present

## 2021-08-25 DIAGNOSIS — M25551 Pain in right hip: Secondary | ICD-10-CM | POA: Diagnosis not present

## 2021-08-25 DIAGNOSIS — Z23 Encounter for immunization: Secondary | ICD-10-CM | POA: Diagnosis not present

## 2021-08-25 DIAGNOSIS — E118 Type 2 diabetes mellitus with unspecified complications: Secondary | ICD-10-CM

## 2021-08-25 DIAGNOSIS — E785 Hyperlipidemia, unspecified: Secondary | ICD-10-CM

## 2021-08-25 MED ORDER — SIMVASTATIN 40 MG PO TABS: 40.0000 mg | ORAL_TABLET | Freq: Every day | ORAL | 1 refills | Status: DC

## 2021-08-25 MED ORDER — MELOXICAM 15 MG PO TABS: ORAL_TABLET | ORAL | 1 refills | Status: AC

## 2021-08-25 MED ORDER — LISINOPRIL 20 MG PO TABS: 20.0000 mg | ORAL_TABLET | Freq: Every day | ORAL | 3 refills | Status: DC

## 2021-08-25 MED ORDER — DAPAGLIFLOZIN PROPANEDIOL 10 MG PO TABS: 10.0000 mg | ORAL_TABLET | Freq: Every day | ORAL | 0 refills | Status: DC

## 2021-08-25 MED ORDER — CYCLOBENZAPRINE HCL 5 MG PO TABS: 5.0000 mg | ORAL_TABLET | Freq: Three times a day (TID) | ORAL | 1 refills | Status: DC | PRN

## 2021-08-25 NOTE — Progress Notes (Signed)
? ? ?Date:  08/25/2021  ? ?Name:  Daniel Bell   DOB:  [birth year]   MRN:  [id number] ? ? ?Chief Complaint: Diabetes (Foot Exam. MICRO at lab.) and Hypertension ? ?Hypertension ?This is a chronic problem. The problem is controlled. Pertinent negatives include no chest pain, headaches, palpitations or shortness of breath. Past treatments include ACE inhibitors. The current treatment provides significant improvement.  ?Diabetes ?He presents for his follow-up diabetic visit. He has type 2 diabetes mellitus. His disease course has been stable. Pertinent negatives for hypoglycemia include no headaches or tremors. Pertinent negatives for diabetes include no chest pain, no fatigue, no polydipsia and no polyuria. Current diabetic treatments: metformin, farxiga, glipzide.  ? ?Lab Results  ?Component Value Date  ? NA 132 (L) 04/23/2021  ? K 4.2 04/23/2021  ? CO2 23 04/23/2021  ? GLUCOSE 191 (H) 04/23/2021  ? BUN 25 (H) 04/23/2021  ? CREATININE 1.03 04/23/2021  ? CALCIUM 9.6 04/23/2021  ? GFRNONAA >60 04/23/2021  ? ?Lab Results  ?Component Value Date  ? CHOL 122 04/23/2021  ? HDL 49 04/23/2021  ? [place] 60 04/23/2021  ? TRIG 65 04/23/2021  ? CHOLHDL 2.5 04/23/2021  ? ?No results found for: TSH ?Lab Results  ?Component Value Date  ? HGBA1C 8.0 (H) 04/23/2021  ? ?Lab Results  ?Component Value Date  ? WBC 7.6 08/19/2020  ? HGB 17.4 (H) 08/19/2020  ? HCT 47.8 08/19/2020  ? MCV 85.7 08/19/2020  ? PLT 218 08/19/2020  ? ?Lab Results  ?Component Value Date  ? ALT 27 08/19/2020  ? AST 23 08/19/2020  ? ALKPHOS 141 (H) 08/19/2020  ? BILITOT 1.0 08/19/2020  ? ?No results found for: 25OHVITD2, [place], VD25OH  ? ?Review of Systems  ?Constitutional:  Negative for appetite change, fatigue and unexpected weight change.  ?Eyes:  Negative for visual disturbance.  ?Respiratory:  Negative for cough, shortness of breath and wheezing.   ?Cardiovascular:  Negative for chest pain, palpitations and leg swelling.  ?Gastrointestinal:  Negative for  abdominal pain and blood in stool.  ?Endocrine: Negative for polydipsia and polyuria.  ?Genitourinary:  Negative for dysuria and hematuria.  ?Skin:  Negative for color change and rash.  ?Neurological:  Negative for tremors, numbness and headaches.  ?Psychiatric/Behavioral:  Negative for dysphoric mood.   ? ?Patient Active Problem List  ? Diagnosis Date Noted  ? Encounter for screening colonoscopy   ? Pain of right hip joint 08/25/2017  ? Hematuria, microscopic 08/25/2017  ? Cervical stenosis of spinal canal 12/10/2015  ? Neural foraminal stenosis of cervical spine 12/10/2015  ? Cervical radiculopathy 11/22/2015  ? Type II diabetes mellitus with complication ([place]) 15/83/0940  ? Essential (primary) hypertension 11/22/2014  ? Glaucoma 11/22/2014  ? Bilateral hearing loss 11/22/2014  ? Hyperlipidemia associated with type 2 diabetes mellitus ([place]) 11/22/2014  ? ? ?Allergies  ?Allergen Reactions  ? Trulicity [Dulaglutide] Diarrhea  ? ? ?Past Surgical History:  ?Procedure Laterality Date  ? COLONOSCOPY WITH PROPOFOL N/A 07/04/2018  ? Procedure: COLONOSCOPY WITH PROPOFOL;  Surgeon: Lin Landsman, MD;  Location: [facility] ENDOSCOPY;  Service: Gastroenterology;  Laterality: N/A;  ? HIP SURGERY Right   ? as a baby for leg length discrepancy  ? ? ?Social History  ? ?Tobacco Use  ? Smoking status: Every Day  ?  Packs/day: 1.00  ?  Years: 30.00  ?  Pack years: 30.00  ?  Types: Cigars, Cigarettes  ?  Last attempt to quit: 08/11/2017  ?  Years since quitting:  4.0  ? Smokeless tobacco: Never  ? Tobacco comments:  ?  3-5 cigars a week  ?Vaping Use  ? Vaping Use: Never used  ?Substance Use Topics  ? Alcohol use: No  ?  Alcohol/week: 0.0 standard drinks  ? Drug use: No  ? ? ? ?Medication list has been reviewed and updated. ? ?Current Meds  ?Medication Sig  ? Accu-Chek FastClix Lancets MISC USE AS DIRECTED 4 TIMES A DAY  ? blood glucose meter kit and supplies KIT Dispense based on patient and insurance preference. Use up to four times  daily as directed. (FOR ICD-9 250.00, 250.01).  ? cyclobenzaprine (FLEXERIL) 5 MG tablet Take 1 tablet (5 mg total) by mouth 3 (three) times daily as needed for muscle spasms.  ? dapagliflozin propanediol (FARXIGA) 5 MG TABS tablet TAKE 1 TABLET (5 MG TOTAL) BY MOUTH DAILY. TAKING SAMPLES  ? glipiZIDE (GLUCOTROL) 5 MG tablet Take 1 tablet (5 mg total) by mouth 2 (two) times daily before a meal.  ? lisinopril (ZESTRIL) 20 MG tablet Take 1 tablet (20 mg total) by mouth daily.  ? meloxicam (MOBIC) 15 MG tablet TAKE 1 TABLET BY MOUTH EVERY DAY AS NEEDED FOR PAIN  ? metFORMIN (GLUCOPHAGE) 1000 MG tablet Take 1 tablet (1,000 mg total) by mouth 2 (two) times daily.  ? Multiple Vitamins-Minerals (MULTIVITAMIN WITH MINERALS) tablet Take 1 tablet by mouth daily.  ? simvastatin (ZOCOR) 40 MG tablet TAKE 1 TABLET BY MOUTH EVERY DAY  ? ? ? ?  08/25/2021  ? 10:18 AM 04/23/2021  ?  9:27 AM 11/11/2020  ?  1:32 PM 08/19/2020  ? 10:10 AM  ?PHQ 2/9 Scores  ?PHQ - 2 Score 2 2 0 0  ?PHQ- 9 Score 2 2 0 0  ? ? ? ?  08/25/2021  ? 10:19 AM 04/23/2021  ?  9:27 AM 11/11/2020  ?  1:32 PM 08/19/2020  ? 10:10 AM  ?GAD 7 : Generalized Anxiety Score  ?Nervous, Anxious, on Edge 0 0 0 0  ?Control/stop worrying 0 0 0 0  ?Worry too much - different things 0 2 0 0  ?Trouble relaxing 0 0 0 0  ?Restless 0 0 0 0  ?Easily annoyed or irritable 0 0 0 0  ?Afraid - awful might happen 0 2 0 0  ?Total GAD 7 Score 0 4 0 0  ?Anxiety Difficulty Not difficult at all Somewhat difficult  Not difficult at all  ? ? ?BP Readings from Last 3 Encounters:  ?08/25/21 (!) 148/82  ?04/23/21 128/84  ?11/11/20 106/72  ? ? ?Physical Exam ?Vitals and nursing note reviewed.  ?Constitutional:   ?   General: He is not in acute distress. ?   Appearance: Normal appearance. He is well-developed.  ?HENT:  ?   Head: Normocephalic and atraumatic.  ?Neck:  ?   Vascular: No carotid bruit.  ?Cardiovascular:  ?   Rate and Rhythm: Normal rate and regular rhythm.  ?   Pulses: Normal pulses.  ?   Heart  sounds: No murmur heard. ?Pulmonary:  ?   Effort: Pulmonary effort is normal. No respiratory distress.  ?   Breath sounds: No wheezing or rhonchi.  ?Musculoskeletal:  ?   Cervical back: Normal range of motion.  ?   Right lower leg: No edema.  ?   Left lower leg: No edema.  ?Lymphadenopathy:  ?   Cervical: No cervical adenopathy.  ?Skin: ?   General: Skin is warm and dry.  ?  Capillary Refill: Capillary refill takes less than 2 seconds.  ?   Findings: No rash.  ?Neurological:  ?   Mental Status: He is alert and oriented to person, place, and time.  ?Psychiatric:     ?   Mood and Affect: Mood normal.     ?   Behavior: Behavior normal.  ? ? ?Wt Readings from Last 3 Encounters:  ?08/25/21 126 lb 6.4 oz (57.3 kg)  ?04/23/21 121 lb (54.9 kg)  ?11/11/20 132 lb (59.9 kg)  ? ? ?BP (!) 148/82   Pulse 100   Ht _0  (1.6 m)   Wt 126 lb 6.4 oz (57.3 kg)   SpO2 96%   BMI 22.39 kg/m?  ? ?Assessment and Plan: ?1. Essential (primary) hypertension ?BP fairly well controlled - will continue current medication and adjust next visit if needed ?- Basic metabolic panel ?- lisinopril (ZESTRIL) 20 MG tablet; Take 1 tablet (20 mg total) by mouth daily.  Dispense: 90 tablet; Refill: 3 ? ?2. Type II diabetes mellitus with complication (HCC) ?Last A1C 8.0.  Will give samples of Farxiga 10 mg. ?Advise if he should continue the same dose. ?Referral to Ophthalmology ?- Hemoglobin A1c ?- Microalbumin / creatinine urine ratio ?- Ambulatory referral to Ophthalmology ?- dapagliflozin propanediol (FARXIGA) 10 MG TABS tablet; Take 1 tablet (10 mg total) by mouth daily. TAKE 1 TABLET (5 MG TOTAL) BY MOUTH DAILY. TAKING SAMPLES  Dispense: 90 tablet; Refill: 0 ? ?3. Pain of right hip joint ?- cyclobenzaprine (FLEXERIL) 5 MG tablet; Take 1 tablet (5 mg total) by mouth 3 (three) times daily as needed for muscle spasms.  Dispense: 30 tablet; Refill: 1 ?- meloxicam (MOBIC) 15 MG tablet; TAKE 1 TABLET BY MOUTH EVERY DAY AS NEEDED FOR PAIN  Dispense: 30  tablet; Refill: 1 ? ?4. Hyperlipidemia associated with type 2 diabetes mellitus (HCC) ?- simvastatin (ZOCOR) 40 MG tablet; Take 1 tablet (40 mg total) by mouth daily.  Dispense: 90 tablet; Refill: 1 ? ?5. Need for va

## 2021-08-25 NOTE — Patient Instructions (Addendum)
?  Pneumococcal Conjugate Vaccine (Prevnar 20) Suspension for Injection ?What is this medication? ?PNEUMOCOCCAL VACCINE (NEU mo KOK al vak SEEN) is a vaccine. It prevents pneumococcus bacterial infections. These bacteria can cause serious infections like pneumonia, meningitis, and blood infections. This vaccine will not treat an infection and will not cause infection. This vaccine is recommended for adults 18 years and older. ?This medicine may be used for other purposes; ask your health care provider or pharmacist if you have questions. ?COMMON BRAND NAME(S): Prevnar 20 ?What should I tell my care team before I take this medication? ?They need to know if you have any of these conditions: ?bleeding disorder ?fever ?immune system problems ?an unusual or allergic reaction to pneumococcal vaccine, diphtheria toxoid, other vaccines, other medicines, foods, dyes, or preservatives ?pregnant or trying to get pregnant ?breast-feeding ?How should I use this medication? ?This vaccine is injected into a muscle. It is given by a health care provider. ?A copy of Vaccine Information Statements will be given before each vaccination. Be sure to read this information carefully each time. This sheet may change often. ?Talk to your health care provider about the use of this medicine in children. Special care may be needed. ?Overdosage: If you think you have taken too much of this medicine contact a poison control center or emergency room at once. ?NOTE: This medicine is only for you. Do not share this medicine with others. ?What if I miss a dose? ?This does not apply. This medicine is not for regular use. ?What may interact with this medication? ?medicines for cancer chemotherapy ?medicines that suppress your immune function ?steroid medicines like prednisone or cortisone ?This list may not describe all possible interactions. Give your health care provider a list of all the medicines, herbs, non-prescription drugs, or dietary  supplements you use. Also tell them if you smoke, drink alcohol, or use illegal drugs. Some items may interact with your medicine. ?What should I watch for while using this medication? ?Mild fever and pain should go away in 3 days or less. Report any unusual symptoms to your health care provider. ?What side effects may I notice from receiving this medication? ?Side effects that you should report to your doctor or health care professional as soon as possible: ?allergic reactions (skin rash, itching or hives; swelling of the face, lips, or tongue) ?confusion ?fast, irregular heartbeat ?fever over 102 degrees F ?muscle weakness ?seizures ?trouble breathing ?unusual bruising or bleeding ?Side effects that usually do not require medical attention (report to your doctor or health care professional if they continue or are bothersome): ?fever of 102 degrees F or less ?headache ?joint pain ?muscle cramps, pain ?pain, tender at site where injected ?This list may not describe all possible side effects. Call your doctor for medical advice about side effects. You may report side effects to FDA at 1-800-FDA-1088. ?Where should I keep my medication? ?This vaccine is only given by a health care provider. It will not be stored at home. ?NOTE: This sheet is a summary. It may not cover all possible information. If you have questions about this medicine, talk to your doctor, pharmacist, or health care provider. ?? 2022 Elsevier/Gold Standard (2020-02-01 00:00:00) ? ?

## 2021-09-03 LAB — HEMOGLOBIN A1C
Est. average glucose Bld gHb Est-mCnc: 203 mg/dL
Hgb A1c MFr Bld: 8.7 % — ABNORMAL HIGH (ref 4.8–5.6)

## 2021-09-03 LAB — BASIC METABOLIC PANEL
BUN/Creatinine Ratio: 15 (ref 10–24)
BUN: 15 mg/dL (ref 8–27)
CO2: 22 mmol/L (ref 20–29)
Calcium: 9.8 mg/dL (ref 8.6–10.2)
Chloride: 96 mmol/L (ref 96–106)
Creatinine, Ser: 0.97 mg/dL (ref 0.76–1.27)
Glucose: 388 mg/dL — ABNORMAL HIGH (ref 70–99)
Potassium: 4.4 mmol/L (ref 3.5–5.2)
Sodium: 133 mmol/L — ABNORMAL LOW (ref 134–144)
eGFR: 89 mL/min/{1.73_m2} (ref >59)

## 2021-09-03 LAB — MICROALBUMIN / CREATININE URINE RATIO

## 2021-09-08 ENCOUNTER — Other Ambulatory Visit: Payer: Self-pay

## 2021-09-08 DIAGNOSIS — E118 Type 2 diabetes mellitus with unspecified complications: Secondary | ICD-10-CM

## 2021-09-08 MED ORDER — METFORMIN HCL 1000 MG PO TABS: 1000.0000 mg | ORAL_TABLET | Freq: Two times a day (BID) | ORAL | 1 refills | Status: DC

## 2021-11-17 ENCOUNTER — Ambulatory Visit: Payer: Self-pay

## 2021-11-17 ENCOUNTER — Other Ambulatory Visit: Payer: Self-pay

## 2021-11-17 DIAGNOSIS — E118 Type 2 diabetes mellitus with unspecified complications: Secondary | ICD-10-CM

## 2021-11-17 MED ORDER — GLIPIZIDE 5 MG PO TABS: 5.0000 mg | ORAL_TABLET | Freq: Two times a day (BID) | ORAL | 1 refills | Status: DC

## 2021-11-17 NOTE — Telephone Encounter (Signed)
Requested Prescriptions  Pending Prescriptions Disp Refills  . glipiZIDE (GLUCOTROL) 5 MG tablet 180 tablet 1    Sig: Take 1 tablet (5 mg total) by mouth 2 (two) times daily before a meal.     Endocrinology:  Diabetes - Sulfonylureas Failed - 11/17/2021 10:01 AM      Failed - HBA1C is between 0 and 7.9 and within 180 days    Hgb A1c MFr Bld  Date Value Ref Range Status  08/25/2021 8.7 (H) 4.8 - 5.6 % Final    Comment:             Prediabetes: 5.7 - 6.4          Diabetes: >6.4          Glycemic control for adults with diabetes: <7.0          Passed - Cr in normal range and within 360 days    Creatinine, Ser  Date Value Ref Range Status  08/25/2021 0.97 0.76 - 1.27 mg/dL Final         Passed - Valid encounter within last 6 months    Recent Outpatient Visits          2 months ago Essential (primary) hypertension   [facility] Glean Hess, MD   6 months ago Type II diabetes mellitus with complication [facility])   [facility] Glean Hess, MD   1 year ago Essential (primary) hypertension   [facility] Glean Hess, MD   1 year ago Essential (primary) hypertension   [facility] Glean Hess, MD   2 years ago Type II diabetes mellitus with complication [facility])   [facility] Glean Hess, MD      Future Appointments            In 2 months Army Melia Jesse Sans, MD [facility], [facility]

## 2021-11-17 NOTE — Telephone Encounter (Signed)
  Chief Complaint: med refill Symptoms: NA Frequency: today Pertinent Negatives: NA Disposition: _0 ED /_1 Urgent Care (no appt availability in office) / _2 Appointment(In office/virtual)/ _3  [facility]/ _4 Home Care/ _5 Refused Recommended Disposition /_6 [facility]/ _7  Follow-up with PCP Additional Notes: pt was needing refill on glipizide. Was unsure which medication he needed refill and reviewed DM medication. Sent in refill for glipizide d/t met refill protocol.   Summary: Medication Rx   Pt stated he needs a refill on a medication for his "sugar".   Pt stated he doesn't know the name.   Pt seeking clinical advice.      Reason for Disposition  [1] Caller requesting a prescription renewal (no refills left), no triage required, AND [2] triager able to renew prescription per department policy  Answer Assessment - Initial Assessment Questions 1. DRUG NAME: "What medicine do you need to have refilled?"     Glipizide  2. REFILLS REMAINING: "How many refills are remaining?" (Note: The label on the medicine or pill bottle will show how many refills are remaining. If there are no refills remaining, then a renewal may be needed.)     0      4. PRESCRIBING HCP: "Who prescribed it?" Reason: If prescribed by specialist, call should be referred to that group.     Dr. Army Melia  Protocols used: Medication Refill and Renewal Call-A-AH

## 2022-01-27 ENCOUNTER — Other Ambulatory Visit
Admission: RE | Admit: 2022-01-27 | Discharge: 2022-01-27 | Disposition: A | Discharge status: Home / Self Care | Payer: BC Managed Care – PPO | Attending: Internal Medicine | Admitting: Internal Medicine

## 2022-01-27 ENCOUNTER — Ambulatory Visit (INDEPENDENT_AMBULATORY_CARE_PROVIDER_SITE_OTHER): Payer: BC Managed Care – PPO | Admitting: Internal Medicine

## 2022-01-27 ENCOUNTER — Encounter: Payer: Self-pay | Admitting: Internal Medicine

## 2022-01-27 VITALS — BP 108/74 | HR 88 | Ht 63.0 in | Wt 124.0 lb

## 2022-01-27 DIAGNOSIS — E1169 Type 2 diabetes mellitus with other specified complication: Secondary | ICD-10-CM

## 2022-01-27 DIAGNOSIS — I10 Essential (primary) hypertension: Secondary | ICD-10-CM

## 2022-01-27 DIAGNOSIS — Z125 Encounter for screening for malignant neoplasm of prostate: Secondary | ICD-10-CM | POA: Insufficient documentation

## 2022-01-27 DIAGNOSIS — E785 Hyperlipidemia, unspecified: Secondary | ICD-10-CM

## 2022-01-27 DIAGNOSIS — Z1211 Encounter for screening for malignant neoplasm of colon: Secondary | ICD-10-CM

## 2022-01-27 DIAGNOSIS — B356 Tinea cruris: Secondary | ICD-10-CM

## 2022-01-27 DIAGNOSIS — E118 Type 2 diabetes mellitus with unspecified complications: Secondary | ICD-10-CM

## 2022-01-27 LAB — CBC WITH DIFFERENTIAL/PLATELET
Abs Immature Granulocytes: 0.03 10*3/uL (ref 0.00–0.07)
Basophils Absolute: 0.1 10*3/uL (ref 0.0–0.1)
Basophils Relative: 1 %
Eosinophils Absolute: 0.3 10*3/uL (ref 0.0–0.5)
Eosinophils Relative: 2 %
HCT: 47.7 % (ref 39.0–52.0)
Hemoglobin: 16.6 g/dL (ref 13.0–17.0)
Immature Granulocytes: 0 %
Lymphocytes Relative: 22 %
Lymphs Abs: 2.6 10*3/uL (ref 0.7–4.0)
MCH: 31.3 pg (ref 26.0–34.0)
MCHC: 34.8 g/dL (ref 30.0–36.0)
MCV: 90 fL (ref 80.0–100.0)
Monocytes Absolute: 0.7 10*3/uL (ref 0.1–1.0)
Monocytes Relative: 6 %
Neutro Abs: 7.9 10*3/uL — ABNORMAL HIGH (ref 1.7–7.7)
Neutrophils Relative %: 69 %
Platelets: 216 10*3/uL (ref 150–400)
RBC: 5.3 MIL/uL (ref 4.22–5.81)
RDW: 12.9 % (ref 11.5–15.5)
WBC: 11.5 10*3/uL — ABNORMAL HIGH (ref 4.0–10.5)
nRBC: 0 % (ref 0.0–0.2)

## 2022-01-27 LAB — COMPREHENSIVE METABOLIC PANEL
ALT: 17 U/L (ref 0–44)
AST: 15 U/L (ref 15–41)
Albumin: 4 g/dL (ref 3.5–5.0)
Alkaline Phosphatase: 116 U/L (ref 38–126)
Anion gap: 4 — ABNORMAL LOW (ref 5–15)
BUN: 17 mg/dL (ref 8–23)
CO2: 29 mmol/L (ref 22–32)
Calcium: 9.1 mg/dL (ref 8.9–10.3)
Chloride: 100 mmol/L (ref 98–111)
Creatinine, Ser: 0.94 mg/dL (ref 0.61–1.24)
GFR, Estimated: >60 mL/min (ref >60)
Glucose, Bld: 198 mg/dL — ABNORMAL HIGH (ref 70–99)
Potassium: 4.7 mmol/L (ref 3.5–5.1)
Sodium: 133 mmol/L — ABNORMAL LOW (ref 135–145)
Total Bilirubin: 0.6 mg/dL (ref 0.3–1.2)
Total Protein: 6.9 g/dL (ref 6.5–8.1)

## 2022-01-27 LAB — LIPID PANEL
Cholesterol: 97 mg/dL (ref 0–200)
HDL: 39 mg/dL — ABNORMAL LOW (ref >40)
LDL Cholesterol: 39 mg/dL (ref 0–99)
Total CHOL/HDL Ratio: 2.5 RATIO
Triglycerides: 97 mg/dL (ref <150)
VLDL: 19 mg/dL (ref 0–40)

## 2022-01-27 LAB — PSA: Prostatic Specific Antigen: 1.35 ng/mL (ref 0.00–4.00)

## 2022-01-27 LAB — HEMOGLOBIN A1C
Hgb A1c MFr Bld: 9.3 % — ABNORMAL HIGH (ref 4.8–5.6)
Mean Plasma Glucose: 220.21 mg/dL

## 2022-01-27 MED ORDER — NYSTATIN-TRIAMCINOLONE 100000-0.1 UNIT/GM-% EX OINT: 1.0000 | TOPICAL_OINTMENT | Freq: Two times a day (BID) | CUTANEOUS | 0 refills | Status: DC

## 2022-01-27 MED ORDER — SIMVASTATIN 40 MG PO TABS: 40.0000 mg | ORAL_TABLET | Freq: Every day | ORAL | 1 refills | Status: DC

## 2022-01-27 MED ORDER — METFORMIN HCL 1000 MG PO TABS
1000.0000 mg | ORAL_TABLET | Freq: Two times a day (BID) | ORAL | 1 refills | Status: DC
Start: 2022-01-27 — End: 2022-06-15

## 2022-01-27 MED ORDER — DAPAGLIFLOZIN PROPANEDIOL 10 MG PO TABS: 10.0000 mg | ORAL_TABLET | Freq: Every day | ORAL | 0 refills | Status: DC

## 2022-01-27 NOTE — Patient Instructions (Signed)
Schedule diabetic Eye exam

## 2022-01-27 NOTE — Progress Notes (Signed)
Date:  01/27/2022   Name:  Daniel Bell   DOB:  12/20/1960   MRN:  [id number]   Chief Complaint: Annual Exam Daniel Bell is a 61 y.o. male who presents today for his Complete Annual Exam. He feels well. He reports exercising walking at work. He reports he is sleeping well.   Colonoscopy: 07/2018 repeat 3 yrs  Immunization History  Administered Date(s) Administered   Influenza,inj,Quad PF,6+ Mos 06/17/2015, 03/23/2017, 06/08/2018, 02/08/2019, 04/23/2021   PNEUMOCOCCAL CONJUGATE-20 08/25/2021   PPD Test 08/12/2015   Pneumococcal Polysaccharide-23 06/17/2015   Tdap 08/08/2015   Health Maintenance Due  Topic Date Due   COVID-19 Vaccine (1) Never done   Zoster Vaccines- Shingrix (1 of 2) Never done   OPHTHALMOLOGY EXAM  11/24/2018   COLONOSCOPY (Pts 45-33yr Insurance coverage will need to be confirmed)  07/04/2021   INFLUENZA VACCINE  12/30/2021    No results found for: "PSA1", "PSA"   Hypertension This is a chronic problem. The problem is controlled. Pertinent negatives include no chest pain, headaches, palpitations or shortness of breath. Past treatments include ACE inhibitors.  Diabetes He presents for his follow-up diabetic visit. He has type 2 diabetes mellitus. His disease course has been worsening. Pertinent negatives for hypoglycemia include no dizziness, headaches or nervousness/anxiousness. Pertinent negatives for diabetes include no chest pain and no fatigue. Current diabetic treatment includes oral agent (dual therapy) (metformin; farxiga samples last visit). He is compliant with treatment all of the time. He is following a generally healthy diet. An ACE inhibitor/angiotensin II receptor blocker is being taken. Eye exam is not current.  Hyperlipidemia This is a chronic problem. The problem is controlled. Pertinent negatives include no chest pain, myalgias or shortness of breath. Current antihyperlipidemic treatment includes statins. The current treatment provides significant  improvement of lipids.    Lab Results  Component Value Date   NA 133 (L) 08/25/2021   K 4.4 08/25/2021   CO2 22 08/25/2021   GLUCOSE 388 (H) 08/25/2021   BUN 15 08/25/2021   CREATININE 0.97 08/25/2021   CALCIUM 9.8 08/25/2021   EGFR 89 08/25/2021   GFRNONAA >60 04/23/2021   Lab Results  Component Value Date   CHOL 122 04/23/2021   HDL 49 04/23/2021   LDLCALC 60 04/23/2021   TRIG 65 04/23/2021   CHOLHDL 2.5 04/23/2021   No results found for: "TSH" Lab Results  Component Value Date   HGBA1C 8.7 (H) 08/25/2021   Lab Results  Component Value Date   WBC 7.6 08/19/2020   HGB 17.4 (H) 08/19/2020   HCT 47.8 08/19/2020   MCV 85.7 08/19/2020   PLT 218 08/19/2020   Lab Results  Component Value Date   ALT 27 08/19/2020   AST 23 08/19/2020   ALKPHOS 141 (H) 08/19/2020   BILITOT 1.0 08/19/2020   No results found for: "25OHVITD2", "25OHVITD3", "VD25OH"   Review of Systems  Constitutional:  Negative for appetite change, chills, diaphoresis, fatigue and unexpected weight change.  HENT:  Negative for hearing loss, tinnitus, trouble swallowing and voice change.   Eyes:  Negative for visual disturbance.  Respiratory:  Negative for choking, shortness of breath and wheezing.   Cardiovascular:  Negative for chest pain, palpitations and leg swelling.  Gastrointestinal:  Negative for abdominal pain, blood in stool, constipation and diarrhea.  Genitourinary:  Negative for difficulty urinating, dysuria and frequency.  Musculoskeletal:  Positive for arthralgias and gait problem. Negative for back pain and myalgias.  Skin:  Positive for rash. Negative for  color change.  Neurological:  Negative for dizziness, syncope and headaches.  Hematological:  Negative for adenopathy.  Psychiatric/Behavioral:  Negative for dysphoric mood and sleep disturbance. The patient is not nervous/anxious.     Patient Active Problem List   Diagnosis Date Noted   Encounter for screening colonoscopy    Pain  of right hip joint 08/25/2017   Hematuria, microscopic 08/25/2017   Cervical stenosis of spinal canal 12/10/2015   Neural foraminal stenosis of cervical spine 12/10/2015   Cervical radiculopathy 11/22/2015   Type II diabetes mellitus with complication ([place]) 56/38/7564   Essential (primary) hypertension 11/22/2014   Glaucoma 11/22/2014   Bilateral hearing loss 11/22/2014   Hyperlipidemia associated with type 2 diabetes mellitus ([place]) 11/22/2014    Allergies  Allergen Reactions   Trulicity [Dulaglutide] Diarrhea    Past Surgical History:  Procedure Laterality Date   COLONOSCOPY WITH PROPOFOL N/A 07/04/2018   Procedure: COLONOSCOPY WITH PROPOFOL;  Surgeon: Lin Landsman, MD;  Location: [facility] ENDOSCOPY;  Service: Gastroenterology;  Laterality: N/A;   HIP SURGERY Right    as a baby for leg length discrepancy    Social History   Tobacco Use   Smoking status: Every Day    Packs/day: 1.00    Years: 30.00    Total pack years: 30.00    Types: Cigars, Cigarettes    Last attempt to quit: 08/11/2017    Years since quitting: 4.4   Smokeless tobacco: Never   Tobacco comments:    3-5 cigars a week  Vaping Use   Vaping Use: Never used  Substance Use Topics   Alcohol use: No    Alcohol/week: 0.0 standard drinks of alcohol   Drug use: No     Medication list has been reviewed and updated.  Current Meds  Medication Sig   Accu-Chek FastClix Lancets MISC USE AS DIRECTED 4 TIMES A DAY   blood glucose meter kit and supplies KIT Dispense based on patient and insurance preference. Use up to four times daily as directed. (FOR ICD-9 250.00, 250.01).   cyclobenzaprine (FLEXERIL) 5 MG tablet Take 1 tablet (5 mg total) by mouth 3 (three) times daily as needed for muscle spasms.   dapagliflozin propanediol (FARXIGA) 10 MG TABS tablet Take 1 tablet (10 mg total) by mouth daily. TAKE 1 TABLET (5 MG TOTAL) BY MOUTH DAILY. TAKING SAMPLES   glipiZIDE (GLUCOTROL) 5 MG tablet Take 1 tablet (5 mg  total) by mouth 2 (two) times daily before a meal.   lisinopril (ZESTRIL) 20 MG tablet Take 1 tablet (20 mg total) by mouth daily.   meloxicam (MOBIC) 15 MG tablet TAKE 1 TABLET BY MOUTH EVERY DAY AS NEEDED FOR PAIN   metFORMIN (GLUCOPHAGE) 1000 MG tablet Take 1 tablet (1,000 mg total) by mouth 2 (two) times daily.   Multiple Vitamins-Minerals (MULTIVITAMIN WITH MINERALS) tablet Take 1 tablet by mouth daily.   simvastatin (ZOCOR) 40 MG tablet Take 1 tablet (40 mg total) by mouth daily.       01/27/2022    8:35 AM 08/25/2021   10:19 AM 04/23/2021    9:27 AM 11/11/2020    1:32 PM  GAD 7 : Generalized Anxiety Score  Nervous, Anxious, on Edge 0 0 0 0  Control/stop worrying 0 0 0 0  Worry too much - different things 0 0 2 0  Trouble relaxing 0 0 0 0  Restless 0 0 0 0  Easily annoyed or irritable 0 0 0 0  Afraid - awful might happen  0 0 2 0  Total GAD 7 Score 0 0 4 0  Anxiety Difficulty Not difficult at all Not difficult at all Somewhat difficult        01/27/2022    8:34 AM 08/25/2021   10:18 AM 04/23/2021    9:27 AM  Depression screen PHQ 2/9  Decreased Interest 0 2 0  Down, Depressed, Hopeless 0 0 2  PHQ - 2 Score 0 2 2  Altered sleeping 0 0 0  Tired, decreased energy 0 0 0  Change in appetite 0 0 0  Feeling bad or failure about yourself  0 0 0  Trouble concentrating 0 0 0  Moving slowly or fidgety/restless 0 0 0  Suicidal thoughts 0 0 0  PHQ-9 Score 0 2 2  Difficult doing work/chores Not difficult at all Not difficult at all Not difficult at all    BP Readings from Last 3 Encounters:  01/27/22 108/74  08/25/21 (!) 148/82  04/23/21 128/84    Physical Exam Vitals and nursing note reviewed.  Constitutional:      Appearance: Normal appearance. He is well-developed.  HENT:     Head: Normocephalic.     Nose: Nose normal.  Eyes:     Pupils: Pupils are equal, round, and reactive to light.  Neck:     Thyroid: No thyromegaly.     Vascular: No carotid bruit.   Cardiovascular:     Rate and Rhythm: Normal rate and regular rhythm.     Heart sounds: Normal heart sounds.  Pulmonary:     Effort: Pulmonary effort is normal.     Breath sounds: Normal breath sounds. No wheezing.  Chest:  Breasts:    Right: No mass.     Left: No mass.  Abdominal:     General: Bowel sounds are normal.     Palpations: Abdomen is soft.     Tenderness: There is no abdominal tenderness.  Musculoskeletal:        General: Normal range of motion.     Cervical back: Normal range of motion and neck supple.     Right lower leg: No edema.     Left lower leg: No edema.  Lymphadenopathy:     Cervical: No cervical adenopathy.  Skin:    General: Skin is warm and dry.     Capillary Refill: Capillary refill takes less than 2 seconds.     Findings: Rash present.  Neurological:     General: No focal deficit present.     Mental Status: He is alert and oriented to person, place, and time.     Deep Tendon Reflexes: Reflexes are normal and symmetric.  Psychiatric:        Attention and Perception: Attention normal.        Mood and Affect: Mood normal.        Thought Content: Thought content normal.    Diabetic Foot Exam - Simple   No data filed      Wt Readings from Last 3 Encounters:  01/27/22 124 lb (56.2 kg)  08/25/21 126 lb 6.4 oz (57.3 kg)  04/23/21 121 lb (54.9 kg)    BP 108/74   Pulse 88   Ht 5' 3"  (1.6 m)   Wt 124 lb (56.2 kg)   SpO2 99%   BMI 21.97 kg/m   Assessment and Plan: 1. Essential (primary) hypertension Clinically stable exam with well controlled BP. Tolerating medications without side effects at this time. Pt to continue current regimen and low  sodium diet; benefits of regular exercise as able discussed. - CBC with Differential/Platelet  2. Prostate cancer screening DRE deferred - PSA  3. Type II diabetes mellitus with complication (HCC) Now taking 2 medications and going well by his report He does not check his BS He is overdue for  eye exam - Comprehensive metabolic panel - Hemoglobin A1c - Microalbumin / creatinine urine ratio - dapagliflozin propanediol (FARXIGA) 10 MG TABS tablet; Take 1 tablet (10 mg total) by mouth daily. TAKE 1 TABLET (5 MG TOTAL) BY MOUTH DAILY. TAKING SAMPLES  Dispense: 90 tablet; Refill: 0 - metFORMIN (GLUCOPHAGE) 1000 MG tablet; Take 1 tablet (1,000 mg total) by mouth 2 (two) times daily.  Dispense: 180 tablet; Refill: 1  4. Hyperlipidemia associated with type 2 diabetes mellitus ([place]) Tolerating statin medication without side effects at this time LDL is at goal of < 70 on current dose Continue same therapy without change at this time. - Lipid panel - simvastatin (ZOCOR) 40 MG tablet; Take 1 tablet (40 mg total) by mouth daily.  Dispense: 90 tablet; Refill: 1  5. Tinea cruris - nystatin-triamcinolone ointment (MYCOLOG); Apply 1 Application topically 2 (two) times daily. To groin rash  Dispense: 30 g; Refill: 0  6. Colon cancer screening Overdue for repeat colonoscopy for multiple TA in 2020 - Ambulatory referral to Gastroenterology   Partially dictated using Dragon software. Any errors are unintentional.  [name], MD [facility]  01/27/2022

## 2022-01-28 LAB — MICROALBUMIN / CREATININE URINE RATIO
Creatinine, Urine: 61.7 mg/dL
Microalb Creat Ratio: <5 mg/g creat (ref 0–29)
Microalb, Ur: <3 ug/mL — ABNORMAL HIGH

## 2022-01-29 NOTE — Progress Notes (Signed)
Please review.  KP

## 2022-01-29 NOTE — Progress Notes (Signed)
Called pt left VM to call back. ? ?PEC nurse may give results to patient if they return call to clinic, a CRM has been created. ? ?KP

## 2022-01-30 NOTE — Progress Notes (Signed)
Spoke to pt he stated the he is taking the 3 medications that he doesn't want to add another medication until his next visit on 9/29.  KP

## 2022-02-09 ENCOUNTER — Telehealth: Payer: Self-pay | Admitting: Gastroenterology

## 2022-02-09 NOTE — Telephone Encounter (Signed)
Good morning Daniel Bell. Patient needs to be called and triage for a referral that was made by his PCP. He is ready to schedule. Thank you.

## 2022-02-09 NOTE — Telephone Encounter (Signed)
Pt left message on vmx over the weekend. He received a letter and would like a  call back at [phone].

## 2022-02-10 ENCOUNTER — Telehealth: Payer: Self-pay

## 2022-02-10 NOTE — Telephone Encounter (Signed)
LVM for pt to return my call to schedule his colonoscopy.  History of colon polyps.  Last colonoscopy was 07/04/2018 performed by Dr. Marius Ditch.   Thanks,  [place], [place]

## 2022-02-27 ENCOUNTER — Ambulatory Visit (INDEPENDENT_AMBULATORY_CARE_PROVIDER_SITE_OTHER): Payer: BC Managed Care – PPO | Admitting: Internal Medicine

## 2022-02-27 ENCOUNTER — Telehealth: Payer: Self-pay

## 2022-02-27 ENCOUNTER — Encounter: Payer: Self-pay | Admitting: Internal Medicine

## 2022-02-27 DIAGNOSIS — E118 Type 2 diabetes mellitus with unspecified complications: Secondary | ICD-10-CM

## 2022-02-27 MED ORDER — DAPAGLIFLOZIN PROPANEDIOL 10 MG PO TABS: 10.0000 mg | ORAL_TABLET | Freq: Every day | ORAL | 1 refills | Status: DC

## 2022-02-27 MED ORDER — PIOGLITAZONE HCL 15 MG PO TABS: 15.0000 mg | ORAL_TABLET | Freq: Every day | ORAL | 0 refills | Status: DC

## 2022-02-27 NOTE — Telephone Encounter (Signed)
Called patient to schedule his colonoscopy.  He was unable to schedule at this time due to he was driving. He said he will call back on Monday am at 8:30am.  Thanks, Sharyn Lull, [place]

## 2022-02-27 NOTE — Progress Notes (Signed)
Date:  02/27/2022   Name:  Daniel Bell   DOB:  01-Jun-1961   MRN:  [id number]   Chief Complaint: Diabetes  Diabetes He presents for his follow-up diabetic visit. He has type 2 diabetes mellitus. His disease course has been worsening. Pertinent negatives for hypoglycemia include no dizziness or headaches. Pertinent negatives for diabetes include no chest pain and no fatigue. Current diabetic treatment includes oral agent (triple therapy) (glipizide, metformin, [place] Glade). His home blood glucose trend is increasing steadily. An ACE inhibitor/angiotensin II receptor blocker is being taken.  He can not take Trulicity and is adamant that he does not want to take any injections.  He confirms that he is taking glimepiride, metformin and [place].  Lab Results  Component Value Date   NA 133 (L) 01/27/2022   K 4.7 01/27/2022   CO2 29 01/27/2022   GLUCOSE 198 (H) 01/27/2022   BUN 17 01/27/2022   CREATININE 0.94 01/27/2022   CALCIUM 9.1 01/27/2022   EGFR 89 08/25/2021   GFRNONAA >60 01/27/2022   Lab Results  Component Value Date   CHOL 97 01/27/2022   HDL 39 (L) 01/27/2022   LDLCALC 39 01/27/2022   TRIG 97 01/27/2022   CHOLHDL 2.5 01/27/2022   No results found for: "TSH" Lab Results  Component Value Date   HGBA1C 9.3 (H) 01/27/2022   Lab Results  Component Value Date   WBC 11.5 (H) 01/27/2022   HGB 16.6 01/27/2022   HCT 47.7 01/27/2022   MCV 90.0 01/27/2022   PLT 216 01/27/2022   Lab Results  Component Value Date   ALT 17 01/27/2022   AST 15 01/27/2022   ALKPHOS 116 01/27/2022   BILITOT 0.6 01/27/2022   No results found for: "25OHVITD2", "25OHVITD3", "VD25OH"   Review of Systems  Constitutional:  Negative for chills, fatigue and fever.  Respiratory:  Negative for chest tightness and shortness of breath.   Cardiovascular:  Negative for chest pain.  Gastrointestinal:  Negative for abdominal distention, constipation and diarrhea.  Neurological:  Negative for dizziness,  light-headedness and headaches.    Patient Active Problem List   Diagnosis Date Noted   Encounter for screening colonoscopy    Pain of right hip joint 08/25/2017   Hematuria, microscopic 08/25/2017   Cervical stenosis of spinal canal 12/10/2015   Neural foraminal stenosis of cervical spine 12/10/2015   Cervical radiculopathy 11/22/2015   Type II diabetes mellitus with complication ([place]) 00/93/8182   Essential (primary) hypertension 11/22/2014   Glaucoma 11/22/2014   Bilateral hearing loss 11/22/2014   Hyperlipidemia associated with type 2 diabetes mellitus ([place]) 11/22/2014    Allergies  Allergen Reactions   Trulicity [Dulaglutide] Diarrhea    Past Surgical History:  Procedure Laterality Date   COLONOSCOPY WITH PROPOFOL N/A 07/04/2018   Procedure: COLONOSCOPY WITH PROPOFOL;  Surgeon: Lin Landsman, MD;  Location: [facility] ENDOSCOPY;  Service: Gastroenterology;  Laterality: N/A;   HIP SURGERY Right    as a baby for leg length discrepancy    Social History   Tobacco Use   Smoking status: Every Day    Packs/day: 1.00    Years: 30.00    Total pack years: 30.00    Types: Cigars, Cigarettes    Last attempt to quit: 08/11/2017    Years since quitting: 4.5   Smokeless tobacco: Never   Tobacco comments:    3-5 cigars a week  Vaping Use   Vaping Use: Never used  Substance Use Topics   Alcohol use: No  Alcohol/week: 0.0 standard drinks of alcohol   Drug use: No     Medication list has been reviewed and updated.  Current Meds  Medication Sig   Accu-Chek FastClix Lancets MISC USE AS DIRECTED 4 TIMES A DAY   blood glucose meter kit and supplies KIT Dispense based on patient and insurance preference. Use up to four times daily as directed. (FOR ICD-9 250.00, 250.01).   cyclobenzaprine (FLEXERIL) 5 MG tablet Take 1 tablet (5 mg total) by mouth 3 (three) times daily as needed for muscle spasms.   dapagliflozin propanediol (FARXIGA) 10 MG TABS tablet Take 1 tablet (10 mg  total) by mouth daily. TAKE 1 TABLET (5 MG TOTAL) BY MOUTH DAILY. TAKING SAMPLES   glipiZIDE (GLUCOTROL) 5 MG tablet Take 1 tablet (5 mg total) by mouth 2 (two) times daily before a meal.   lisinopril (ZESTRIL) 20 MG tablet Take 1 tablet (20 mg total) by mouth daily.   meloxicam (MOBIC) 15 MG tablet TAKE 1 TABLET BY MOUTH EVERY DAY AS NEEDED FOR PAIN   metFORMIN (GLUCOPHAGE) 1000 MG tablet Take 1 tablet (1,000 mg total) by mouth 2 (two) times daily.   Multiple Vitamins-Minerals (MULTIVITAMIN WITH MINERALS) tablet Take 1 tablet by mouth daily.   nystatin-triamcinolone ointment (MYCOLOG) Apply 1 Application topically 2 (two) times daily. To groin rash   simvastatin (ZOCOR) 40 MG tablet Take 1 tablet (40 mg total) by mouth daily.       02/27/2022   11:08 AM 01/27/2022    8:35 AM 08/25/2021   10:19 AM 04/23/2021    9:27 AM  GAD 7 : Generalized Anxiety Score  Nervous, Anxious, on Edge 0 0 0 0  Control/stop worrying 0 0 0 0  Worry too much - different things 0 0 0 2  Trouble relaxing 0 0 0 0  Restless 0 0 0 0  Easily annoyed or irritable 0 0 0 0  Afraid - awful might happen 0 0 0 2  Total GAD 7 Score 0 0 0 4  Anxiety Difficulty Not difficult at all Not difficult at all Not difficult at all Somewhat difficult       02/27/2022   11:07 AM 01/27/2022    8:34 AM 08/25/2021   10:18 AM  Depression screen PHQ 2/9  Decreased Interest 0 0 2  Down, Depressed, Hopeless 0 0 0  PHQ - 2 Score 0 0 2  Altered sleeping 0 0 0  Tired, decreased energy 0 0 0  Change in appetite 0 0 0  Feeling bad or failure about yourself  0 0 0  Trouble concentrating 0 0 0  Moving slowly or fidgety/restless 0 0 0  Suicidal thoughts 0 0 0  PHQ-9 Score 0 0 2  Difficult doing work/chores Not difficult at all Not difficult at all Not difficult at all    BP Readings from Last 3 Encounters:  02/27/22 114/68  01/27/22 108/74  08/25/21 (!) 148/82    Physical Exam Vitals and nursing note reviewed.  Constitutional:       General: He is not in acute distress.    Appearance: Normal appearance. He is well-developed.  HENT:     Head: Normocephalic and atraumatic.  Cardiovascular:     Rate and Rhythm: Normal rate and regular rhythm.  Pulmonary:     Effort: Pulmonary effort is normal. No respiratory distress.     Breath sounds: No wheezing or rhonchi.  Skin:    General: Skin is warm and dry.  Findings: No rash.  Neurological:     Mental Status: He is alert and oriented to person, place, and time.  Psychiatric:        Mood and Affect: Mood normal.        Behavior: Behavior normal.     Wt Readings from Last 3 Encounters:  02/27/22 128 lb (58.1 kg)  01/27/22 124 lb (56.2 kg)  08/25/21 126 lb 6.4 oz (57.3 kg)    BP 114/68   Pulse 95   Ht 5' 3"  (1.6 m)   Wt 128 lb (58.1 kg)   SpO2 98%   BMI 22.67 kg/m   Assessment and Plan: 1. Type II diabetes mellitus with complication (HCC) BS not controlled on three agents. He had uncontrolled diarrhea with Trulicity. Cost is an issue for him so will add Actos.  He will monitor for hematuria. Follow up in 3 months with [facility]. - dapagliflozin propanediol (FARXIGA) 10 MG TABS tablet; Take 1 tablet (10 mg total) by mouth daily.  Dispense: 90 tablet; Refill: 1 - pioglitazone (ACTOS) 15 MG tablet; Take 1 tablet (15 mg total) by mouth daily.  Dispense: 90 tablet; Refill: 0   Partially dictated using Editor, commissioning. Any errors are unintentional.  [name], MD [facility]  02/27/2022

## 2022-05-04 ENCOUNTER — Other Ambulatory Visit: Payer: Self-pay

## 2022-05-04 ENCOUNTER — Ambulatory Visit: Payer: Self-pay | Admitting: *Deleted

## 2022-05-04 DIAGNOSIS — I10 Essential (primary) hypertension: Secondary | ICD-10-CM

## 2022-05-04 DIAGNOSIS — E118 Type 2 diabetes mellitus with unspecified complications: Secondary | ICD-10-CM

## 2022-05-04 MED ORDER — LISINOPRIL 20 MG PO TABS: 20.0000 mg | ORAL_TABLET | Freq: Every day | ORAL | 0 refills | Status: DC

## 2022-05-04 MED ORDER — DAPAGLIFLOZIN PROPANEDIOL 10 MG PO TABS: 10.0000 mg | ORAL_TABLET | Freq: Every day | ORAL | 0 refills | Status: DC

## 2022-05-04 NOTE — Telephone Encounter (Signed)
  Chief Complaint: medication refill Symptoms: NA Frequency: today Pertinent Negatives: NA Disposition: '[]'$ ED /'[]'$ Urgent Care (no appt availability in office) / '[]'$ Appointment(In office/virtual)/ '[]'$  [facility]/ '[x]'$ Home Care/ '[]'$ Refused Recommended Disposition /'[]'$ [facility]/ '[]'$  Follow-up with PCP Additional Notes: reviewed pt's medications and pt is missing lisinopril and farxiga. Pt doesn't remember getting Farxiga from pharmacy when he picked up Actos but advised I would try to send in rxs for him. Pt verbalized understanding.   Reason for Disposition  [1] Prescription prescribed recently is not at pharmacy AND [2] triager has access to patient's EMR AND [3] prescription is recorded in the EMR  Answer Assessment - Initial Assessment Questions 1. NAME of MEDICINE: "What medicine(s) are you calling about?"     Farxiga and Lisinopril  2. QUESTION: "What is your question?" (e.g., double dose of medicine, side effect)     Unsure which medication he was missing.  3. PRESCRIBER: "Who prescribed the medicine?" Reason: if prescribed by specialist, call should be referred to that group.     [place]  Protocols used: Medication Question Call-A-AH

## 2022-05-04 NOTE — Telephone Encounter (Signed)
2nd call attempted to contact patient on [phone]. No answer, LVMTCB [phone].

## 2022-05-04 NOTE — Telephone Encounter (Signed)
Summary: medication clarity   Pt needs a call to go over what medications he should be taking/ he stated he should be taking 4 but only has three / missing one but not sire of the name of it / please advise      Attempted to call patient- left message to call office

## 2022-05-04 NOTE — Telephone Encounter (Signed)
Requested Prescriptions  Pending Prescriptions Disp Refills   dapagliflozin propanediol (FARXIGA) 10 MG TABS tablet 90 tablet 0    Sig: Take 1 tablet (10 mg total) by mouth daily.     Endocrinology:  Diabetes - SGLT2 Inhibitors Failed - 05/04/2022  6:01 PM      Failed - HBA1C is between 0 and 7.9 and within 180 days    Hgb A1c MFr Bld  Date Value Ref Range Status  01/27/2022 9.3 (H) 4.8 - 5.6 % Final    Comment:    (NOTE) Pre diabetes:          5.7%-6.4%  Diabetes:              >6.4%  Glycemic control for   <7.0% adults with diabetes          Passed - Cr in normal range and within 360 days    Creatinine, Ser  Date Value Ref Range Status  01/27/2022 0.94 0.61 - 1.24 mg/dL Final         Passed - eGFR in normal range and within 360 days    GFR calc Af Amer  Date Value Ref Range Status  06/09/2018 >60 >60 mL/min Final   GFR, Estimated  Date Value Ref Range Status  01/27/2022 >60 >60 mL/min Final    Comment:    (NOTE) Calculated using the CKD-EPI Creatinine Equation (2021)    eGFR  Date Value Ref Range Status  08/25/2021 89 >59 mL/min/1.73 Final         Passed - Valid encounter within last 6 months    Recent Outpatient Visits           2 months ago Type II diabetes mellitus with complication ([place])   [place] [facility] at [facility], Jesse Sans, MD   3 months ago Essential (primary) hypertension   [place] [facility] at [facility], Jesse Sans, MD   8 months ago Essential (primary) hypertension   [place] [facility] at [facility], Jesse Sans, MD   1 year ago Type II diabetes mellitus with complication [facility])   [place] [facility] at [facility], Jesse Sans, MD   1 year ago Essential (primary) hypertension   [place] [facility] at [facility], Jesse Sans, MD       Future Appointments              In 1 month Army Melia Jesse Sans, MD [facility] at [facility], [facility]   In 8 months Glean Hess, MD [facility] Primary [facility] and Sports Medicine at [place], [facility]             lisinopril (ZESTRIL) 20 MG tablet 90 tablet 0    Sig: Take 1 tablet (20 mg total) by mouth daily.     Cardiovascular:  ACE Inhibitors Passed - 05/04/2022  6:01 PM      Passed - Cr in normal range and within 180 days    Creatinine, Ser  Date Value Ref Range Status  01/27/2022 0.94 0.61 - 1.24 mg/dL Final         Passed - K in normal range and within 180 days    Potassium  Date Value Ref Range Status  01/27/2022 4.7 3.5 - 5.1 mmol/L Final         Passed - Patient  is not pregnant      Passed - Last BP in normal range    BP Readings from Last 1 Encounters:  02/27/22 114/68         Passed - Valid encounter within last 6 months    Recent Outpatient Visits           2 months ago Type II diabetes mellitus with complication [facility])   [place] [facility] at [facility], Jesse Sans, MD   3 months ago Essential (primary) hypertension   [place] [facility] at [facility], Jesse Sans, MD   8 months ago Essential (primary) hypertension   [place] [facility] at [facility], Jesse Sans, MD   1 year ago Type II diabetes mellitus with complication [facility])   [place] [facility] at [facility], Jesse Sans, MD   1 year ago Essential (primary) hypertension   [place] [facility] at [facility], Jesse Sans, MD       Future Appointments             In 1 month Army Melia, Jesse Sans, MD [facility] Medicine at [facility], [facility]   In 8 months Army Melia, Jesse Sans, MD [place] [facility] at [facility], [facility]

## 2022-05-22 DIAGNOSIS — Z803 Family history of malignant neoplasm of breast: Secondary | ICD-10-CM | POA: Diagnosis not present

## 2022-05-22 DIAGNOSIS — Z720 Tobacco use: Secondary | ICD-10-CM | POA: Diagnosis not present

## 2022-05-22 DIAGNOSIS — E119 Type 2 diabetes mellitus without complications: Secondary | ICD-10-CM | POA: Diagnosis not present

## 2022-05-22 DIAGNOSIS — R69 Illness, unspecified: Secondary | ICD-10-CM | POA: Diagnosis not present

## 2022-05-22 DIAGNOSIS — Z7984 Long term (current) use of oral hypoglycemic drugs: Secondary | ICD-10-CM | POA: Diagnosis not present

## 2022-05-22 DIAGNOSIS — Z833 Family history of diabetes mellitus: Secondary | ICD-10-CM | POA: Diagnosis not present

## 2022-05-22 DIAGNOSIS — I10 Essential (primary) hypertension: Secondary | ICD-10-CM | POA: Diagnosis not present

## 2022-05-22 DIAGNOSIS — E785 Hyperlipidemia, unspecified: Secondary | ICD-10-CM | POA: Diagnosis not present

## 2022-05-26 ENCOUNTER — Other Ambulatory Visit: Payer: Self-pay | Admitting: Internal Medicine

## 2022-05-26 DIAGNOSIS — E118 Type 2 diabetes mellitus with unspecified complications: Secondary | ICD-10-CM

## 2022-06-04 ENCOUNTER — Ambulatory Visit: Payer: Self-pay | Admitting: Internal Medicine

## 2022-06-15 ENCOUNTER — Ambulatory Visit (INDEPENDENT_AMBULATORY_CARE_PROVIDER_SITE_OTHER): Payer: 59 | Admitting: Internal Medicine

## 2022-06-15 ENCOUNTER — Ambulatory Visit: Payer: Self-pay

## 2022-06-15 ENCOUNTER — Other Ambulatory Visit: Payer: Self-pay | Admitting: Internal Medicine

## 2022-06-15 ENCOUNTER — Encounter: Payer: Self-pay | Admitting: Internal Medicine

## 2022-06-15 VITALS — BP 112/78 | HR 88 | Ht 63.0 in | Wt 128.0 lb

## 2022-06-15 DIAGNOSIS — E118 Type 2 diabetes mellitus with unspecified complications: Secondary | ICD-10-CM

## 2022-06-15 DIAGNOSIS — M542 Cervicalgia: Secondary | ICD-10-CM

## 2022-06-15 LAB — POCT URINALYSIS DIPSTICK
Bilirubin, UA: NEGATIVE
Glucose, UA: POSITIVE — AB
Ketones, UA: NEGATIVE
Leukocytes, UA: NEGATIVE
Nitrite, UA: NEGATIVE
Protein, UA: NEGATIVE
Spec Grav, UA: 1.015 (ref 1.010–1.025)
Urobilinogen, UA: 0.2 E.U./dL
pH, UA: 7.5 (ref 5.0–8.0)

## 2022-06-15 LAB — POCT GLYCOSYLATED HEMOGLOBIN (HGB A1C): Hemoglobin A1C: 11.3 % — AB (ref 4.0–5.6)

## 2022-06-15 MED ORDER — METFORMIN HCL 1000 MG PO TABS: 1000.0000 mg | ORAL_TABLET | Freq: Two times a day (BID) | ORAL | 1 refills | Status: DC

## 2022-06-15 MED ORDER — GLIPIZIDE 5 MG PO TABS: 5.0000 mg | ORAL_TABLET | Freq: Two times a day (BID) | ORAL | 1 refills | Status: DC

## 2022-06-15 MED ORDER — DAPAGLIFLOZIN PROPANEDIOL 10 MG PO TABS: 10.0000 mg | ORAL_TABLET | Freq: Every day | ORAL | 0 refills | Status: DC

## 2022-06-15 MED ORDER — CYCLOBENZAPRINE HCL 5 MG PO TABS: 5.0000 mg | ORAL_TABLET | Freq: Three times a day (TID) | ORAL | 1 refills | Status: DC | PRN

## 2022-06-15 NOTE — Progress Notes (Signed)
Date:  06/15/2022   Name:  Daniel Bell   DOB:  22-Jul-1960   MRN:  [id number]   Chief Complaint: Neck Pain  Neck Pain  This is a chronic problem. Episode onset: X 5 days. The problem occurs constantly. Progression since onset: comes and goes. Associated with: from sit to stand positions. The pain is at a severity of 7/10. The pain is moderate. Exacerbated by: sit to stand. The pain is Worse during the night. Stiffness is present All day. Pertinent negatives include no chest pain or fever. He has tried acetaminophen and heat (icy hot) for the symptoms. The treatment provided mild relief.  Diabetes He presents for his follow-up diabetic visit. He has type 2 diabetes mellitus. His disease course has been worsening (last visit - Actos added). Pertinent negatives for hypoglycemia include no nervousness/anxiousness. Pertinent negatives for diabetes include no chest pain and no fatigue. Current diabetic treatments: metformin, farxiga, Actos. He is compliant with treatment some of the time.    Lab Results  Component Value Date   NA 133 (L) 01/27/2022   K 4.7 01/27/2022   CO2 29 01/27/2022   GLUCOSE 198 (H) 01/27/2022   BUN 17 01/27/2022   CREATININE 0.94 01/27/2022   CALCIUM 9.1 01/27/2022   EGFR 89 08/25/2021   GFRNONAA >60 01/27/2022   Lab Results  Component Value Date   CHOL 97 01/27/2022   HDL 39 (L) 01/27/2022   LDLCALC 39 01/27/2022   TRIG 97 01/27/2022   CHOLHDL 2.5 01/27/2022   No results found for: "TSH" Lab Results  Component Value Date   HGBA1C 11.3 (A) 06/15/2022   Lab Results  Component Value Date   WBC 11.5 (H) 01/27/2022   HGB 16.6 01/27/2022   HCT 47.7 01/27/2022   MCV 90.0 01/27/2022   PLT 216 01/27/2022   Lab Results  Component Value Date   ALT 17 01/27/2022   AST 15 01/27/2022   ALKPHOS 116 01/27/2022   BILITOT 0.6 01/27/2022   No results found for: "25OHVITD2", "25OHVITD3", "VD25OH"   Review of Systems  Constitutional:  Negative for chills, fatigue  and fever.  Eyes:  Negative for visual disturbance.  Respiratory:  Negative for chest tightness, shortness of breath and wheezing.   Cardiovascular:  Negative for chest pain.  Musculoskeletal:  Positive for arthralgias and neck pain.  Psychiatric/Behavioral:  Negative for dysphoric mood and sleep disturbance. The patient is not nervous/anxious.     Patient Active Problem List   Diagnosis Date Noted   Encounter for screening colonoscopy    Pain of right hip joint 08/25/2017   Hematuria, microscopic 08/25/2017   Cervical stenosis of spinal canal 12/10/2015   Neural foraminal stenosis of cervical spine 12/10/2015   Cervical radiculopathy 11/22/2015   Type II diabetes mellitus with complication ([place]) 27/78/2423   Essential (primary) hypertension 11/22/2014   Glaucoma 11/22/2014   Bilateral hearing loss 11/22/2014   Hyperlipidemia associated with type 2 diabetes mellitus ([place]) 11/22/2014    Allergies  Allergen Reactions   Trulicity [Dulaglutide] Diarrhea    Past Surgical History:  Procedure Laterality Date   COLONOSCOPY WITH PROPOFOL N/A 07/04/2018   Procedure: COLONOSCOPY WITH PROPOFOL;  Surgeon: Lin Landsman, MD;  Location: [facility] ENDOSCOPY;  Service: Gastroenterology;  Laterality: N/A;   HIP SURGERY Right    as a baby for leg length discrepancy    Social History   Tobacco Use   Smoking status: Every Day    Packs/day: 1.00    Years: 30.00  Total pack years: 30.00    Types: Cigars, Cigarettes    Last attempt to quit: 08/11/2017    Years since quitting: 4.8   Smokeless tobacco: Never   Tobacco comments:    3-5 cigars a week  Vaping Use   Vaping Use: Never used  Substance Use Topics   Alcohol use: No    Alcohol/week: 0.0 standard drinks of alcohol   Drug use: No     Medication list has been reviewed and updated.  Current Meds  Medication Sig   Accu-Chek FastClix Lancets MISC USE AS DIRECTED 4 TIMES A DAY   blood glucose meter kit and supplies KIT Dispense  based on patient and insurance preference. Use up to four times daily as directed. (FOR ICD-9 250.00, 250.01).   lisinopril (ZESTRIL) 20 MG tablet Take 1 tablet (20 mg total) by mouth daily.   meloxicam (MOBIC) 15 MG tablet TAKE 1 TABLET BY MOUTH EVERY DAY AS NEEDED FOR PAIN   Multiple Vitamins-Minerals (MULTIVITAMIN WITH MINERALS) tablet Take 1 tablet by mouth daily.   nystatin-triamcinolone ointment (MYCOLOG) Apply 1 Application topically 2 (two) times daily. To groin rash   pioglitazone (ACTOS) 15 MG tablet TAKE 1 TABLET (15 MG TOTAL) BY MOUTH DAILY.   simvastatin (ZOCOR) 40 MG tablet Take 1 tablet (40 mg total) by mouth daily.   [DISCONTINUED] cyclobenzaprine (FLEXERIL) 5 MG tablet Take 1 tablet (5 mg total) by mouth 3 (three) times daily as needed for muscle spasms.   [DISCONTINUED] dapagliflozin propanediol (FARXIGA) 10 MG TABS tablet Take 1 tablet (10 mg total) by mouth daily.   [DISCONTINUED] glipiZIDE (GLUCOTROL) 5 MG tablet Take 1 tablet (5 mg total) by mouth 2 (two) times daily before a meal.   [DISCONTINUED] metFORMIN (GLUCOPHAGE) 1000 MG tablet Take 1 tablet (1,000 mg total) by mouth 2 (two) times daily.       02/27/2022   11:08 AM 01/27/2022    8:35 AM 08/25/2021   10:19 AM 04/23/2021    9:27 AM  GAD 7 : Generalized Anxiety Score  Nervous, Anxious, on Edge 0 0 0 0  Control/stop worrying 0 0 0 0  Worry too much - different things 0 0 0 2  Trouble relaxing 0 0 0 0  Restless 0 0 0 0  Easily annoyed or irritable 0 0 0 0  Afraid - awful might happen 0 0 0 2  Total GAD 7 Score 0 0 0 4  Anxiety Difficulty Not difficult at all Not difficult at all Not difficult at all Somewhat difficult       02/27/2022   11:07 AM 01/27/2022    8:34 AM 08/25/2021   10:18 AM  Depression screen PHQ 2/9  Decreased Interest 0 0 2  Down, Depressed, Hopeless 0 0 0  PHQ - 2 Score 0 0 2  Altered sleeping 0 0 0  Tired, decreased energy 0 0 0  Change in appetite 0 0 0  Feeling bad or failure about  yourself  0 0 0  Trouble concentrating 0 0 0  Moving slowly or fidgety/restless 0 0 0  Suicidal thoughts 0 0 0  PHQ-9 Score 0 0 2  Difficult doing work/chores Not difficult at all Not difficult at all Not difficult at all    BP Readings from Last 3 Encounters:  06/15/22 112/78  02/27/22 114/68  01/27/22 108/74    Physical Exam Vitals and nursing note reviewed.  Constitutional:      General: He is not in acute distress.  Appearance: He is well-developed.  HENT:     Head: Normocephalic and atraumatic.  Cardiovascular:     Rate and Rhythm: Normal rate and regular rhythm.  Pulmonary:     Effort: Pulmonary effort is normal. No respiratory distress.  Musculoskeletal:     Cervical back: Spasms and tenderness present. Decreased range of motion.     Right lower leg: No edema.     Left lower leg: No edema.  Skin:    General: Skin is warm and dry.     Findings: No rash.  Neurological:     Mental Status: He is alert and oriented to person, place, and time.  Psychiatric:        Mood and Affect: Mood normal.        Behavior: Behavior normal.     Wt Readings from Last 3 Encounters:  06/15/22 128 lb (58.1 kg)  02/27/22 128 lb (58.1 kg)  01/27/22 124 lb (56.2 kg)    BP 112/78   Pulse 88   Ht '5\' 3"'$  (1.6 m)   Wt 128 lb (58.1 kg)   SpO2 99%   BMI 22.67 kg/m   Assessment and Plan: Problem List Items Addressed This Visit       Endocrine   Type II diabetes mellitus with complication ([place]) - Primary (Chronic)    On metformin, actos and Farxiga with poor control last visit Actos added due to financial issues A1C today is 11.3 due to medication noncompliance Once again reminded him to take medications every day      Relevant Medications   glipiZIDE (GLUCOTROL) 5 MG tablet   dapagliflozin propanediol (FARXIGA) 10 MG TABS tablet   metFORMIN (GLUCOPHAGE) 1000 MG tablet   Other Relevant Orders   POCT HgB A1C (Completed)   POCT urinalysis dipstick   Other Visit Diagnoses      Neck pain       hx of cervical radiculopathy - none today resume Flexeril tid, use heat and take Tylenol out of work for 4 days   Relevant Medications   cyclobenzaprine (FLEXERIL) 5 MG tablet        Partially dictated using Editor, commissioning. Any errors are unintentional.  [name], MD [facility]  06/15/2022

## 2022-06-15 NOTE — Telephone Encounter (Signed)
   Chief Complaint: Neck pain Symptoms: Pain Frequency: Last week Pertinent Negatives: Patient denies weakness Disposition: '[]'$ ED /'[]'$ Urgent Care (no appt availability in office) / '[x]'$ Appointment(In office/virtual)/ '[]'$  [place] [facility]/ '[]'$ Home Care/ '[]'$ Refused Recommended Disposition /'[]'$ [place] [facility]/ '[]'$  Follow-up with PCP Additional Notes:    Reason for Disposition  [1] SEVERE neck pain (e.g., excruciating, unable to do any normal activities) AND [2] not improved after 2 hours of pain medicine  Answer Assessment - Initial Assessment Questions 1. ONSET: "When did the pain begin?"      Last week 2. LOCATION: "Where does it hurt?"      Back of neck 3. PATTERN "Does the pain come and go, or has it been constant since it started?"      Constant 4. SEVERITY: "How bad is the pain?"  (Scale 1-10; or mild, moderate, severe)   - NO PAIN (0): no pain or only slight stiffness    - MILD (1-3): doesn't interfere with normal activities    - MODERATE (4-7): interferes with normal activities or awakens from sleep    - SEVERE (8-10):  excruciating pain, unable to do any normal activities      Now - 7 5. RADIATION: "Does the pain go anywhere else, shoot into your arms?"     No 6. CORD SYMPTOMS: "Any weakness or numbness of the arms or legs?"     NO 7. CAUSE: "What do you think is causing the neck pain?"     Unsure 8. NECK OVERUSE: "Any recent activities that involved turning or twisting the neck?"     No 9. OTHER SYMPTOMS: "Do you have any other symptoms?" (e.g., headache, fever, chest pain, difficulty breathing, neck swelling)     No 10. PREGNANCY: "Is there any chance you are pregnant?" "When was your last menstrual period?"       N/a  Protocols used: Neck Pain or Stiffness-A-AH

## 2022-06-15 NOTE — Assessment & Plan Note (Addendum)
On metformin, actos and [place] with poor control last visit Actos added due to financial issues A1C today is 11.3 due to medication noncompliance Once again reminded him to take medications every day

## 2022-06-15 NOTE — Assessment & Plan Note (Signed)
Having recurrent bilateral neck pain without numbness or weakness Taking Tylenol but no improvement Will add muscle relaxant and give three days off of work. Use also heat.

## 2022-06-24 ENCOUNTER — Other Ambulatory Visit: Payer: Self-pay | Admitting: Internal Medicine

## 2022-06-24 DIAGNOSIS — B356 Tinea cruris: Secondary | ICD-10-CM

## 2022-06-24 DIAGNOSIS — E118 Type 2 diabetes mellitus with unspecified complications: Secondary | ICD-10-CM

## 2022-06-25 ENCOUNTER — Telehealth: Payer: Self-pay | Admitting: Internal Medicine

## 2022-06-25 NOTE — Telephone Encounter (Signed)
Copied from [place] [id number]. Topic: General - Other >> Jun 25, 2022 10:10 AM Cyndi Bender wrote: Reason for CRM: Pt stated he returned back to work and the stiff neck has come back. Pt requests that Dr. Army Melia calls him about getting a collar or something for his stiff neck. Cb# [phone]

## 2022-06-25 NOTE — Telephone Encounter (Signed)
Called pt told him that he could buy a cervical collar OTC. Pt verbalized understanding.  Pt stated flexeril was not covered by insurance. Pt wants something else sent in.  KP

## 2022-06-26 ENCOUNTER — Other Ambulatory Visit: Payer: Self-pay | Admitting: Internal Medicine

## 2022-06-26 DIAGNOSIS — M542 Cervicalgia: Secondary | ICD-10-CM

## 2022-06-26 MED ORDER — BACLOFEN 10 MG PO TABS: 10.0000 mg | ORAL_TABLET | Freq: Three times a day (TID) | ORAL | 0 refills | Status: DC

## 2022-06-26 NOTE — Telephone Encounter (Signed)
Pt aware and verbalized understanding.  KP

## 2022-08-31 ENCOUNTER — Telehealth: Payer: Self-pay

## 2022-08-31 NOTE — Telephone Encounter (Signed)
Called patient and left VM asking pt to call ck to reschedule his appt on April 15th to another day. Dr Army Melia will not be here April 15th afternoon.  Ioana Louks

## 2022-09-14 ENCOUNTER — Ambulatory Visit: Payer: BC Managed Care – PPO | Admitting: Internal Medicine

## 2022-09-26 ENCOUNTER — Other Ambulatory Visit: Payer: Self-pay | Admitting: Internal Medicine

## 2022-09-26 DIAGNOSIS — E118 Type 2 diabetes mellitus with unspecified complications: Secondary | ICD-10-CM

## 2022-12-08 ENCOUNTER — Other Ambulatory Visit: Payer: Self-pay | Admitting: Internal Medicine

## 2022-12-08 DIAGNOSIS — E118 Type 2 diabetes mellitus with unspecified complications: Secondary | ICD-10-CM

## 2023-01-05 ENCOUNTER — Encounter: Payer: Self-pay | Admitting: Internal Medicine

## 2023-01-28 ENCOUNTER — Encounter: Payer: Self-pay | Admitting: Internal Medicine

## 2023-03-09 ENCOUNTER — Encounter: Payer: Self-pay | Admitting: Internal Medicine

## 2023-03-09 ENCOUNTER — Ambulatory Visit (INDEPENDENT_AMBULATORY_CARE_PROVIDER_SITE_OTHER): Payer: Self-pay | Admitting: Internal Medicine

## 2023-03-09 ENCOUNTER — Other Ambulatory Visit
Admission: RE | Admit: 2023-03-09 | Discharge: 2023-03-09 | Disposition: A | Discharge status: Home / Self Care | Payer: BC Managed Care – PPO | Attending: Internal Medicine | Admitting: Internal Medicine

## 2023-03-09 VITALS — BP 132/70 | HR 114 | Ht 63.0 in | Wt 133.0 lb

## 2023-03-09 DIAGNOSIS — E785 Hyperlipidemia, unspecified: Secondary | ICD-10-CM | POA: Insufficient documentation

## 2023-03-09 DIAGNOSIS — E118 Type 2 diabetes mellitus with unspecified complications: Secondary | ICD-10-CM | POA: Insufficient documentation

## 2023-03-09 DIAGNOSIS — I10 Essential (primary) hypertension: Secondary | ICD-10-CM

## 2023-03-09 DIAGNOSIS — E1169 Type 2 diabetes mellitus with other specified complication: Secondary | ICD-10-CM

## 2023-03-09 DIAGNOSIS — M542 Cervicalgia: Secondary | ICD-10-CM

## 2023-03-09 DIAGNOSIS — Z7984 Long term (current) use of oral hypoglycemic drugs: Secondary | ICD-10-CM

## 2023-03-09 DIAGNOSIS — Z23 Encounter for immunization: Secondary | ICD-10-CM

## 2023-03-09 LAB — LIPID PANEL
Cholesterol: 102 mg/dL (ref 0–200)
HDL: 33 mg/dL — ABNORMAL LOW (ref >40)
LDL Cholesterol: 18 mg/dL (ref 0–99)
Total CHOL/HDL Ratio: 3.1 {ratio}
Triglycerides: 253 mg/dL — ABNORMAL HIGH (ref <150)
VLDL: 51 mg/dL — ABNORMAL HIGH (ref 0–40)

## 2023-03-09 LAB — CBC WITH DIFFERENTIAL/PLATELET
Abs Immature Granulocytes: 0.02 10*3/uL (ref 0.00–0.07)
Basophils Absolute: 0.1 10*3/uL (ref 0.0–0.1)
Basophils Relative: 1 %
Eosinophils Absolute: 0.3 10*3/uL (ref 0.0–0.5)
Eosinophils Relative: 4 %
HCT: 47.7 % (ref 39.0–52.0)
Hemoglobin: 17 g/dL (ref 13.0–17.0)
Immature Granulocytes: 0 %
Lymphocytes Relative: 34 %
Lymphs Abs: 2.6 10*3/uL (ref 0.7–4.0)
MCH: 30.8 pg (ref 26.0–34.0)
MCHC: 35.6 g/dL (ref 30.0–36.0)
MCV: 86.4 fL (ref 80.0–100.0)
Monocytes Absolute: 0.6 10*3/uL (ref 0.1–1.0)
Monocytes Relative: 8 %
Neutro Abs: 4.1 10*3/uL (ref 1.7–7.7)
Neutrophils Relative %: 53 %
Platelets: 216 10*3/uL (ref 150–400)
RBC: 5.52 MIL/uL (ref 4.22–5.81)
RDW: 13.4 % (ref 11.5–15.5)
WBC: 7.8 10*3/uL (ref 4.0–10.5)
nRBC: 0 % (ref 0.0–0.2)

## 2023-03-09 LAB — URINALYSIS, ROUTINE W REFLEX MICROSCOPIC
Glucose, UA: 500 mg/dL — AB
Hgb urine dipstick: NEGATIVE
Ketones, ur: 15 mg/dL — AB
Leukocytes,Ua: NEGATIVE
Nitrite: NEGATIVE
Protein, ur: NEGATIVE mg/dL
Specific Gravity, Urine: 1.025 (ref 1.005–1.030)
pH: 5.5 (ref 5.0–8.0)

## 2023-03-09 LAB — COMPREHENSIVE METABOLIC PANEL
ALT: 12 U/L (ref 0–44)
AST: 20 U/L (ref 15–41)
Albumin: 3.7 g/dL (ref 3.5–5.0)
Alkaline Phosphatase: 120 U/L (ref 38–126)
Anion gap: 11 (ref 5–15)
BUN: 16 mg/dL (ref 8–23)
CO2: 21 mmol/L — ABNORMAL LOW (ref 22–32)
Calcium: 8.8 mg/dL — ABNORMAL LOW (ref 8.9–10.3)
Chloride: 103 mmol/L (ref 98–111)
Creatinine, Ser: 1.24 mg/dL (ref 0.61–1.24)
GFR, Estimated: >60 mL/min (ref >60)
Glucose, Bld: 233 mg/dL — ABNORMAL HIGH (ref 70–99)
Potassium: 4.1 mmol/L (ref 3.5–5.1)
Sodium: 135 mmol/L (ref 135–145)
Total Bilirubin: 0.5 mg/dL (ref 0.3–1.2)
Total Protein: 6.5 g/dL (ref 6.5–8.1)

## 2023-03-09 MED ORDER — LISINOPRIL 20 MG PO TABS: 20.0000 mg | ORAL_TABLET | Freq: Every day | ORAL | 1 refills | Status: DC

## 2023-03-09 MED ORDER — BACLOFEN 10 MG PO TABS
10.0000 mg | ORAL_TABLET | Freq: Three times a day (TID) | ORAL | 0 refills | Status: DC
Start: 2023-03-09 — End: 2023-05-13

## 2023-03-09 NOTE — Assessment & Plan Note (Addendum)
Normal exam with mildly elevated BP - out of lisinopril. No concerns or side effects to current medication. No change in regimen; continue low sodium diet.

## 2023-03-09 NOTE — Assessment & Plan Note (Signed)
Not seen for follow up in 10 months. Last A1C high due to medication non compliance. Will obtain labs today and advise. Current regimen is metformin, Actos and glipizide.

## 2023-03-09 NOTE — Progress Notes (Signed)
Date:  03/09/2023   Name:  Daniel Bell   DOB:  1960/09/29   MRN:  [id number]   Chief Complaint: Diabetes, Hypertension, and Hyperlipidemia  Diabetes He presents for his follow-up diabetic visit. He has type 2 diabetes mellitus. His disease course has been fluctuating. Pertinent negatives for diabetes include no blurred vision, no fatigue, no foot paresthesias, no foot ulcerations, no polydipsia, no polyphagia, no visual change, no weakness and no weight loss.  Hypertension This is a chronic problem. The problem is controlled. Pertinent negatives include no blurred vision. Past treatments include ACE inhibitors. The current treatment provides significant improvement.  Hyperlipidemia This is a chronic problem. Current antihyperlipidemic treatment includes statins (poor compliance).    Review of Systems  Constitutional:  Negative for fatigue and weight loss.  Eyes:  Negative for blurred vision.  Endocrine: Negative for polydipsia and polyphagia.  Neurological:  Negative for weakness.     Lab Results  Component Value Date   NA 133 (L) 01/27/2022   K 4.7 01/27/2022   CO2 29 01/27/2022   GLUCOSE 198 (H) 01/27/2022   BUN 17 01/27/2022   CREATININE 0.94 01/27/2022   CALCIUM 9.1 01/27/2022   EGFR 89 08/25/2021   GFRNONAA >60 01/27/2022   Lab Results  Component Value Date   CHOL 97 01/27/2022   HDL 39 (L) 01/27/2022   LDLCALC 39 01/27/2022   TRIG 97 01/27/2022   CHOLHDL 2.5 01/27/2022   No results found for: "TSH" Lab Results  Component Value Date   HGBA1C 11.3 (A) 06/15/2022   Lab Results  Component Value Date   WBC 11.5 (H) 01/27/2022   HGB 16.6 01/27/2022   HCT 47.7 01/27/2022   MCV 90.0 01/27/2022   PLT 216 01/27/2022   Lab Results  Component Value Date   ALT 17 01/27/2022   AST 15 01/27/2022   ALKPHOS 116 01/27/2022   BILITOT 0.6 01/27/2022   No results found for: "25OHVITD2", "25OHVITD3", "VD25OH"   Patient Active Problem List   Diagnosis Date Noted    Encounter for screening colonoscopy    Pain of right hip joint 08/25/2017   Hematuria, microscopic 08/25/2017   Cervical stenosis of spinal canal 12/10/2015   Neural foraminal stenosis of cervical spine 12/10/2015   Cervical radiculopathy 11/22/2015   Type II diabetes mellitus with complication (HCC) 11/22/2014   Essential (primary) hypertension 11/22/2014   Glaucoma 11/22/2014   Bilateral hearing loss 11/22/2014   Hyperlipidemia associated with type 2 diabetes mellitus (HCC) 11/22/2014    Allergies  Allergen Reactions   Trulicity [Dulaglutide] Diarrhea    Past Surgical History:  Procedure Laterality Date   COLONOSCOPY WITH PROPOFOL N/A 07/04/2018   Procedure: COLONOSCOPY WITH PROPOFOL;  Surgeon: Toney Reil, MD;  Location: [facility] ENDOSCOPY;  Service: Gastroenterology;  Laterality: N/A;   HIP SURGERY Right    as a baby for leg length discrepancy    Social History   Tobacco Use   Smoking status: Former    Current packs/day: 0.50    Average packs/day: 0.5 packs/day for 35.6 years (17.8 ttl pk-yrs)    Types: Cigars, Cigarettes    Start date: 08/12/1987   Smokeless tobacco: Never   Tobacco comments:    3-5 cigars a week since stopping cigarettes  Vaping Use   Vaping status: Never Used  Substance Use Topics   Alcohol use: No    Alcohol/week: 0.0 standard drinks of alcohol   Drug use: No     Medication list has been reviewed and  updated.  Current Meds  Medication Sig   Accu-Chek FastClix Lancets MISC USE AS DIRECTED 4 TIMES A DAY   blood glucose meter kit and supplies KIT Dispense based on patient and insurance preference. Use up to four times daily as directed. (FOR ICD-9 250.00, 250.01).   glipiZIDE (GLUCOTROL) 5 MG tablet TAKE 1 TABLET (5 MG TOTAL) BY MOUTH TWICE A DAY BEFORE MEALS   meloxicam (MOBIC) 15 MG tablet TAKE 1 TABLET BY MOUTH EVERY DAY AS NEEDED FOR PAIN   metFORMIN (GLUCOPHAGE) 1000 MG tablet TAKE 1 TABLET BY MOUTH TWICE A DAY   Multiple  Vitamins-Minerals (MULTIVITAMIN WITH MINERALS) tablet Take 1 tablet by mouth daily.   nystatin-triamcinolone ointment (MYCOLOG) APPLY 1 APPLICATION TOPICALLY 2 (TWO) TIMES DAILY. TO GROIN RASH   pioglitazone (ACTOS) 15 MG tablet TAKE 1 TABLET (15 MG TOTAL) BY MOUTH DAILY. (Patient taking differently: Take 15 mg by mouth daily.)   simvastatin (ZOCOR) 40 MG tablet Take 1 tablet (40 mg total) by mouth daily.   [DISCONTINUED] baclofen (LIORESAL) 10 MG tablet Take 1 tablet (10 mg total) by mouth 3 (three) times daily.       03/09/2023    2:33 PM 02/27/2022   11:08 AM 01/27/2022    8:35 AM 08/25/2021   10:19 AM  GAD 7 : Generalized Anxiety Score  Nervous, Anxious, on Edge 0 0 0 0  Control/stop worrying 0 0 0 0  Worry too much - different things 0 0 0 0  Trouble relaxing 0 0 0 0  Restless 0 0 0 0  Easily annoyed or irritable 0 0 0 0  Afraid - awful might happen 0 0 0 0  Total GAD 7 Score 0 0 0 0  Anxiety Difficulty Not difficult at all Not difficult at all Not difficult at all Not difficult at all       03/09/2023    2:32 PM 02/27/2022   11:07 AM 01/27/2022    8:34 AM  Depression screen PHQ 2/9  Decreased Interest 0 0 0  Down, Depressed, Hopeless 0 0 0  PHQ - 2 Score 0 0 0  Altered sleeping 0 0 0  Tired, decreased energy 1 0 0  Change in appetite 0 0 0  Feeling bad or failure about yourself  0 0 0  Trouble concentrating 0 0 0  Moving slowly or fidgety/restless 0 0 0  Suicidal thoughts  0 0  PHQ-9 Score 1 0 0  Difficult doing work/chores Not difficult at all Not difficult at all Not difficult at all    BP Readings from Last 3 Encounters:  03/09/23 132/70  06/15/22 112/78  02/27/22 114/68    Physical Exam Vitals and nursing note reviewed.  Constitutional:      General: He is not in acute distress.    Appearance: He is well-developed.  HENT:     Head: Normocephalic and atraumatic.  Pulmonary:     Effort: Pulmonary effort is normal. No respiratory distress.  Skin:     General: Skin is warm and dry.     Findings: No rash.  Neurological:     Mental Status: He is alert and oriented to person, place, and time.  Psychiatric:        Mood and Affect: Mood normal.        Behavior: Behavior normal.    Diabetic Foot Exam - Simple   Simple Foot Form Diabetic Foot exam was performed with the following findings: Yes 03/09/2023  2:54 PM  Visual  Inspection No deformities, no ulcerations, no other skin breakdown bilaterally: Yes Sensation Testing Intact to touch and monofilament testing bilaterally: Yes Pulse Check Posterior Tibialis and Dorsalis pulse intact bilaterally: Yes Comments      Wt Readings from Last 3 Encounters:  03/09/23 133 lb (60.3 kg)  06/15/22 128 lb (58.1 kg)  02/27/22 128 lb (58.1 kg)    BP 132/70   Pulse (!) 114   Ht 5\' 3"  (1.6 m)   Wt 133 lb (60.3 kg)   SpO2 97%   BMI 23.56 kg/m   Assessment and Plan:  Problem List Items Addressed This Visit       Unprioritized   Essential (primary) hypertension (Chronic)    Normal exam with mildly elevated BP - out of lisinopril. No concerns or side effects to current medication. No change in regimen; continue low sodium diet.       Relevant Medications   lisinopril (ZESTRIL) 20 MG tablet   Other Relevant Orders   CBC with Differential/Platelet   Hyperlipidemia associated with type 2 diabetes mellitus (HCC) (Chronic)    LDL is  Lab Results  Component Value Date   LDLCALC 39 01/27/2022  Simvastatin not filled since 06/2022 for #30 but he still has a bottle and is taking it.       Relevant Medications   lisinopril (ZESTRIL) 20 MG tablet   Other Relevant Orders   Lipid panel   Type II diabetes mellitus with complication (HCC) - Primary (Chronic)    Not seen for follow up in 10 months. Last A1C high due to medication non compliance. Will obtain labs today and advise. Current regimen is metformin, Actos and glipizide.      Relevant Medications   lisinopril (ZESTRIL) 20 MG  tablet   Other Relevant Orders   Comprehensive metabolic panel   Hemoglobin A1c   Microalbumin / creatinine urine ratio   Urinalysis, Routine w reflex microscopic   Other Visit Diagnoses     Long term current use of oral hypoglycemic drug       Need for influenza vaccination       Relevant Orders   Flu vaccine trivalent PF, 6mos and older(Flulaval,Afluria,Fluarix,Fluzone) (Completed)   Neck pain       Relevant Medications   baclofen (LIORESAL) 10 MG tablet       Return in about 4 months (around 07/10/2023) for DM, HTN.    Reubin Milan, MD [facility] [place]

## 2023-03-09 NOTE — Assessment & Plan Note (Addendum)
LDL is  Lab Results  Component Value Date   LDLCALC 39 01/27/2022  Simvastatin not filled since 06/2022 for #30 but he still has a bottle and is taking it.

## 2023-03-10 ENCOUNTER — Other Ambulatory Visit: Payer: Self-pay | Admitting: Internal Medicine

## 2023-03-10 DIAGNOSIS — E118 Type 2 diabetes mellitus with unspecified complications: Secondary | ICD-10-CM

## 2023-03-10 LAB — MICROALBUMIN / CREATININE URINE RATIO
Creatinine, Urine: 190 mg/dL
Microalb Creat Ratio: 6 mg/g{creat} (ref 0–29)
Microalb, Ur: 11.3 ug/mL — ABNORMAL HIGH

## 2023-03-10 LAB — HEMOGLOBIN A1C
Hgb A1c MFr Bld: 10.6 % — ABNORMAL HIGH (ref 4.8–5.6)
Mean Plasma Glucose: 257.52 mg/dL

## 2023-03-10 MED ORDER — EMPAGLIFLOZIN 25 MG PO TABS
25.0000 mg | ORAL_TABLET | Freq: Every day | ORAL | 0 refills | Status: DC
Start: 2023-03-10 — End: 2023-06-08

## 2023-03-10 NOTE — Progress Notes (Signed)
KP

## 2023-03-22 ENCOUNTER — Ambulatory Visit: Payer: BC Managed Care – PPO | Admitting: Internal Medicine

## 2023-03-25 ENCOUNTER — Telehealth: Payer: Self-pay | Admitting: Internal Medicine

## 2023-03-25 NOTE — Telephone Encounter (Signed)
Copied from CRM [id number]. Topic: Appointment Scheduling - Scheduling Inquiry for Clinic >> Mar 25, 2023  3:37 PM Epimenio Foot F wrote: Reason for CRM: Pt is calling in because he was just hired at [employer] and needs Dr. Judithann Graves to fill out a form for the job. Earliest appointment was on 04/23/23 and pt says he cannot wait that long and wants to know if there is a sooner appointment. Pt is requesting a callback. Pt says if he doesn't answer it is okay to leave a VM.

## 2023-03-29 ENCOUNTER — Encounter: Payer: Self-pay | Admitting: Internal Medicine

## 2023-03-29 ENCOUNTER — Other Ambulatory Visit
Admission: RE | Admit: 2023-03-29 | Discharge: 2023-03-29 | Disposition: A | Discharge status: Home / Self Care | Payer: Self-pay | Attending: Internal Medicine | Admitting: Internal Medicine

## 2023-03-29 ENCOUNTER — Ambulatory Visit: Payer: BC Managed Care – PPO | Admitting: Internal Medicine

## 2023-03-29 VITALS — BP 112/70 | HR 107 | Ht 63.0 in | Wt 132.0 lb

## 2023-03-29 DIAGNOSIS — Z7984 Long term (current) use of oral hypoglycemic drugs: Secondary | ICD-10-CM

## 2023-03-29 DIAGNOSIS — Z111 Encounter for screening for respiratory tuberculosis: Secondary | ICD-10-CM

## 2023-03-29 DIAGNOSIS — I10 Essential (primary) hypertension: Secondary | ICD-10-CM

## 2023-03-29 DIAGNOSIS — E118 Type 2 diabetes mellitus with unspecified complications: Secondary | ICD-10-CM

## 2023-03-29 NOTE — Assessment & Plan Note (Signed)
BP controlled on lisinopril

## 2023-03-29 NOTE — Progress Notes (Signed)
Date:  03/29/2023   Name:  Daniel Bell   DOB:  1960-09-20   MRN:  [id number]   Chief Complaint: No chief complaint on file.  Hypertension This is a chronic problem. The problem is controlled. Pertinent negatives include no chest pain, headaches, palpitations or shortness of breath. Past treatments include ACE inhibitors.  Diabetes He presents for his follow-up diabetic visit. He has type 2 diabetes mellitus. His disease course has been improving. Pertinent negatives for hypoglycemia include no headaches or tremors. Pertinent negatives for diabetes include no chest pain, no fatigue, no polydipsia and no polyuria. Current diabetic treatments: Jardiance added last visit.  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  TB screen positive due to DM so testing is needed.  Review of Systems  Constitutional:  Negative for appetite change, fatigue and unexpected weight change.  Eyes:  Negative for visual disturbance.  Respiratory:  Negative for cough, shortness of breath and wheezing.   Cardiovascular:  Negative for chest pain, palpitations and leg swelling.  Gastrointestinal:  Negative for abdominal pain and blood in stool.  Endocrine: Negative for polydipsia and polyuria.  Genitourinary:  Negative for dysuria and hematuria.  Skin:  Negative for color change and rash.  Neurological:  Negative for tremors, numbness and headaches.  Psychiatric/Behavioral:  Negative for dysphoric mood.      Lab Results  Component Value Date   NA 135 03/09/2023   K 4.1 03/09/2023   CO2 21 (L) 03/09/2023   GLUCOSE 233 (H) 03/09/2023   BUN 16 03/09/2023   CREATININE 1.24 03/09/2023   CALCIUM 8.8 (L) 03/09/2023   EGFR 89 08/25/2021   GFRNONAA >60 03/09/2023   Lab Results  Component Value Date   CHOL 102 03/09/2023   HDL 33 (L) 03/09/2023   LDLCALC 18 03/09/2023   TRIG 253 (H) 03/09/2023   CHOLHDL 3.1 03/09/2023   No results found for: "TSH" Lab Results  Component Value Date    HGBA1C 10.6 (H) 03/09/2023   Lab Results  Component Value Date   WBC 7.8 03/09/2023   HGB 17.0 03/09/2023   HCT 47.7 03/09/2023   MCV 86.4 03/09/2023   PLT 216 03/09/2023   Lab Results  Component Value Date   ALT 12 03/09/2023   AST 20 03/09/2023   ALKPHOS 120 03/09/2023   BILITOT 0.5 03/09/2023   No results found for: "25OHVITD2", "25OHVITD3", "VD25OH"   Patient Active Problem List   Diagnosis Date Noted   Encounter for screening colonoscopy    Pain of right hip joint 08/25/2017   Hematuria, microscopic 08/25/2017   Cervical stenosis of spinal canal 12/10/2015   Neural foraminal stenosis of cervical spine 12/10/2015   Cervical radiculopathy 11/22/2015   Type II diabetes mellitus with complication (HCC) 11/22/2014   Essential (primary) hypertension 11/22/2014   Glaucoma 11/22/2014   Bilateral hearing loss 11/22/2014   Hyperlipidemia associated with type 2 diabetes mellitus (HCC) 11/22/2014    Allergies  Allergen Reactions   Trulicity [Dulaglutide] Diarrhea    Past Surgical History:  Procedure Laterality Date   COLONOSCOPY WITH PROPOFOL N/A 07/04/2018   Procedure: COLONOSCOPY WITH PROPOFOL;  Surgeon: Toney Reil, MD;  Location: [facility] END[facility]COPY;  Service: Gastroenterology;  Laterality: N/A;   HIP SURGERY Right    as a baby for leg length discrepancy    Social History   Tobacco Use   Smoking status: Former    Current packs/day: 0.50    Average packs/day: 0.5 packs/day for 35.6  years (17.8 ttl pk-yrs)    Types: Cigars, Cigarettes    Start date: 08/12/1987   Smokeless tobacco: Never   Tobacco comments:    3-5 cigars a week since stopping cigarettes  Vaping Use   Vaping status: Never Used  Substance Use Topics   Alcohol use: No    Alcohol/week: 0.0 standard drinks of alcohol   Drug use: No     Medication list has been reviewed and updated.  Current Meds  Medication Sig   Accu-Chek FastClix Lancets MISC USE AS DIRECTED 4 TIMES A DAY   baclofen  (LIORESAL) 10 MG tablet Take 1 tablet (10 mg total) by mouth 3 (three) times daily.   blood glucose meter kit and supplies KIT Dispense based on patient and insurance preference. Use up to four times daily as directed. (FOR ICD-9 250.00, 250.01).   empagliflozin (JARDIANCE) 25 MG TABS tablet Take 1 tablet (25 mg total) by mouth daily before breakfast.   glipiZIDE (GLUCOTROL) 5 MG tablet TAKE 1 TABLET (5 MG TOTAL) BY MOUTH TWICE A DAY BEFORE MEALS   lisinopril (ZESTRIL) 20 MG tablet Take 1 tablet (20 mg total) by mouth daily.   meloxicam (MOBIC) 15 MG tablet TAKE 1 TABLET BY MOUTH EVERY DAY AS NEEDED FOR PAIN   metFORMIN (GLUCOPHAGE) 1000 MG tablet TAKE 1 TABLET BY MOUTH TWICE A DAY   Multiple Vitamins-Minerals (MULTIVITAMIN WITH MINERALS) tablet Take 1 tablet by mouth daily.   nystatin-triamcinolone ointment (MYCOLOG) APPLY 1 APPLICATION TOPICALLY 2 (TWO) TIMES DAILY. TO GROIN RASH   pioglitazone (ACTOS) 15 MG tablet TAKE 1 TABLET (15 MG TOTAL) BY MOUTH DAILY. (Patient taking differently: Take 15 mg by mouth daily.)   simvastatin (ZOCOR) 40 MG tablet Take 1 tablet (40 mg total) by mouth daily.       03/29/2023    8:53 AM 03/09/2023    2:33 PM 02/27/2022   11:08 AM 01/27/2022    8:35 AM  GAD 7 : Generalized Anxiety Score  Nervous, Anxious, on Edge 0 0 0 0  Control/stop worrying 0 0 0 0  Worry too much - different things 0 0 0 0  Trouble relaxing 0 0 0 0  Restless 0 0 0 0  Easily annoyed or irritable 0 0 0 0  Afraid - awful might happen 0 0 0 0  Total GAD 7 Score 0 0 0 0  Anxiety Difficulty Not difficult at all Not difficult at all Not difficult at all Not difficult at all       03/29/2023    8:53 AM 03/09/2023    2:32 PM 02/27/2022   11:07 AM  Depression screen PHQ 2/9  Decreased Interest 0 0 0  Down, Depressed, Hopeless 0 0 0  PHQ - 2 Score 0 0 0  Altered sleeping 0 0 0  Tired, decreased energy 0 1 0  Change in appetite 0 0 0  Feeling bad or failure about yourself  0 0 0   Trouble concentrating 0 0 0  Moving slowly or fidgety/restless 0 0 0  Suicidal thoughts 0  0  PHQ-9 Score 0 1 0  Difficult doing work/chores Not difficult at all Not difficult at all Not difficult at all    BP Readings from Last 3 Encounters:  03/29/23 112/70  03/09/23 132/70  06/15/22 112/78    Physical Exam Vitals and nursing note reviewed.  Constitutional:      General: He is not in acute distress.    Appearance: He is well-developed.  HENT:     Head: Normocephalic and atraumatic.  Pulmonary:     Effort: Pulmonary effort is normal. No respiratory distress.  Skin:    General: Skin is warm and dry.     Findings: No rash.  Neurological:     Mental Status: He is alert and oriented to person, place, and time.  Psychiatric:        Mood and Affect: Mood normal.        Behavior: Behavior normal.     Wt Readings from Last 3 Encounters:  03/29/23 132 lb (59.9 kg)  03/09/23 133 lb (60.3 kg)  06/15/22 128 lb (58.1 kg)    BP 112/70   Pulse (!) 107   Ht 5\' 3"  (1.6 m)   Wt 132 lb (59.9 kg)   SpO2 99%   BMI 23.38 kg/m   Assessment and Plan:  Problem List Items Addressed This Visit       Unprioritized   Essential (primary) hypertension - Primary (Chronic)    BP controlled on lisinopril.      Type II diabetes mellitus with complication (HCC) (Chronic)    BS is not controlled as of 2 weeks ago. Jardiance started in addition to continuing metformin, Actos and glucotrol. Follow up in 4 months.      Other Visit Diagnoses     Long term current use of oral hypoglycemic drug       Screening-pulmonary TB       Relevant Orders   QuantiFERON-TB Gold Plus       No follow-ups on file.    Reubin Milan, MD [facility] Primary Care and Sports Medicine [place]

## 2023-03-29 NOTE — Assessment & Plan Note (Addendum)
BS is not controlled as of 2 weeks ago. Jardiance started in addition to continuing metformin, Actos and glucotrol. Follow up in 4 months.

## 2023-03-31 ENCOUNTER — Telehealth: Payer: Self-pay | Admitting: Internal Medicine

## 2023-03-31 NOTE — Telephone Encounter (Signed)
They are not back. We will call patient once they are ready.

## 2023-03-31 NOTE — Telephone Encounter (Signed)
Patient called stated he needs TB test and form back asap as he has been hired for a job and they need it. Advised him the results have not come back yet but he will be contacted once they do.

## 2023-03-31 NOTE — Telephone Encounter (Signed)
Copied from CRM [id number]. Topic: General - Other >> Mar 31, 2023  9:20 AM Franchot Heidelberg wrote: Reason for CRM: Pt called for status update on TB test results, wants to be contacted once they are ready for pick up.   Best contact: [phone]

## 2023-04-01 LAB — QUANTIFERON-TB GOLD PLUS: QuantiFERON-TB Gold Plus: NEGATIVE

## 2023-04-01 LAB — QUANTIFERON-TB GOLD PLUS (RQFGPL)
QuantiFERON Mitogen Value: >10 [IU]/mL
QuantiFERON Nil Value: 0 [IU]/mL
QuantiFERON TB1 Ag Value: 0 [IU]/mL
QuantiFERON TB2 Ag Value: 0 [IU]/mL

## 2023-04-19 ENCOUNTER — Other Ambulatory Visit: Payer: Self-pay | Admitting: Internal Medicine

## 2023-04-19 DIAGNOSIS — E118 Type 2 diabetes mellitus with unspecified complications: Secondary | ICD-10-CM

## 2023-04-20 NOTE — Telephone Encounter (Signed)
Requested Prescriptions  Refused Prescriptions Disp Refills   pioglitazone (ACTOS) 15 MG tablet [Pharmacy Med Name: PIOGLITAZONE HCL 15 MG TABLET] 90 tablet 1    Sig: TAKE 1 TABLET (15 MG TOTAL) BY MOUTH DAILY.     Endocrinology:  Diabetes - Glitazones - pioglitazone Failed - 04/19/2023 11:24 AM      Failed - HBA1C is between 0 and 7.9 and within 180 days    Hgb A1c MFr Bld  Date Value Ref Range Status  03/09/2023 10.6 (H) 4.8 - 5.6 % Final    Comment:    (NOTE) Pre diabetes:          5.7%-6.4%  Diabetes:              >6.4%  Glycemic control for   <7.0% adults with diabetes          Passed - Valid encounter within last 6 months    Recent Outpatient Visits           3 weeks ago Essential (primary) hypertension   [place] [facility] at [facility], Nyoka Cowden, MD   1 month ago Type II diabetes mellitus with complication [facility])   [place] [facility] at [facility], Nyoka Cowden, MD   10 months ago Type II diabetes mellitus with complication [facility])   [place] [facility] at [facility], Nyoka Cowden, MD   1 year ago Type II diabetes mellitus with complication [facility])   [place] [facility] at [facility], Nyoka Cowden, MD   1 year ago Essential (primary) hypertension   [place] [facility] at [facility], Nyoka Cowden, MD       Future Appointments             In 2 months Judithann Graves, Nyoka Cowden, MD [facility] Primary Care & Sports Medicine at [facility], [facility]

## 2023-05-13 ENCOUNTER — Other Ambulatory Visit: Payer: Self-pay | Admitting: Internal Medicine

## 2023-05-13 DIAGNOSIS — M542 Cervicalgia: Secondary | ICD-10-CM

## 2023-05-13 NOTE — Telephone Encounter (Signed)
Requested Prescriptions  Pending Prescriptions Disp Refills   baclofen (LIORESAL) 10 MG tablet [Pharmacy Med Name: BACLOFEN 10 MG TABLET] 90 tablet 0    Sig: TAKE 1 TABLET BY MOUTH THREE TIMES A DAY     Analgesics:  Muscle Relaxants - baclofen Passed - 05/13/2023 10:24 AM      Passed - Cr in normal range and within 180 days    Creatinine, Ser  Date Value Ref Range Status  03/09/2023 1.24 0.61 - 1.24 mg/dL Final         Passed - eGFR is 30 or above and within 180 days    GFR calc Af Amer  Date Value Ref Range Status  06/09/2018 >60 >60 mL/min Final   GFR, Estimated  Date Value Ref Range Status  03/09/2023 >60 >60 mL/min Final    Comment:    (NOTE) Calculated using the CKD-EPI Creatinine Equation (2021)    eGFR  Date Value Ref Range Status  08/25/2021 89 >59 mL/min/1.73 Final         Passed - Valid encounter within last 6 months    Recent Outpatient Visits           1 month ago Essential (primary) hypertension   [place] [facility] at [facility], Nyoka Cowden, MD   2 months ago Type II diabetes mellitus with complication [facility])   [place] [facility] Medicine at [facility], Nyoka Cowden, MD   11 months ago Type II diabetes mellitus with complication [facility])   [place] [facility] at [facility], Nyoka Cowden, MD   1 year ago Type II diabetes mellitus with complication [facility])   [place] [facility] at [facility], Nyoka Cowden, MD   1 year ago Essential (primary) hypertension   [place] [facility] Medicine at [facility], Nyoka Cowden, MD       Future Appointments             In 2 months Judithann Graves, Nyoka Cowden, MD [facility] Primary Care & [facility] Medicine at [facility], [facility]

## 2023-06-07 ENCOUNTER — Other Ambulatory Visit: Payer: Self-pay | Admitting: Internal Medicine

## 2023-06-07 DIAGNOSIS — E118 Type 2 diabetes mellitus with unspecified complications: Secondary | ICD-10-CM

## 2023-06-08 NOTE — Telephone Encounter (Signed)
 Requested Prescriptions  Pending Prescriptions Disp Refills   JARDIANCE  25 MG TABS tablet [Pharmacy Med Name: JARDIANCE  25 MG TABLET] 90 tablet 0    Sig: TAKE 1 TABLET BY MOUTH DAILY BEFORE BREAKFAST.     Endocrinology:  Diabetes - SGLT2 Inhibitors Failed - 06/08/2023  7:13 PM      Failed - HBA1C is between 0 and 7.9 and within 180 days    Hgb A1c MFr Bld  Date Value Ref Range Status  03/09/2023 10.6 (H) 4.8 - 5.6 % Final    Comment:    (NOTE) Pre diabetes:          5.7%-6.4%  Diabetes:              >6.4%  Glycemic control for   <7.0% adults with diabetes          Passed - Cr in normal range and within 360 days    Creatinine, Ser  Date Value Ref Range Status  03/09/2023 1.24 0.61 - 1.24 mg/dL Final         Passed - eGFR in normal range and within 360 days    GFR calc Af Amer  Date Value Ref Range Status  06/09/2018 >60 >60 mL/min Final   GFR, Estimated  Date Value Ref Range Status  03/09/2023 >60 >60 mL/min Final    Comment:    (NOTE) Calculated using the CKD-EPI Creatinine Equation (2021)    eGFR  Date Value Ref Range Status  08/25/2021 89 >59 mL/min/1.73 Final         Passed - Valid encounter within last 6 months    Recent Outpatient Visits           2 months ago Essential (primary) hypertension   [place] [facility] at [facility], Leita DEL, MD   3 months ago Type II diabetes mellitus with complication [facility])   [place] [facility] at [facility], Leita DEL, MD   11 months ago Type II diabetes mellitus with complication [facility])   [place] [facility] at [facility], Leita DEL, MD   1 year ago Type II diabetes mellitus with complication [facility])   [place] [facility] Sports Medicine at [facility], Leita DEL, MD   1 year ago Essential (primary) hypertension   [place] [facility] Medicine at [facility], Leita DEL, MD        Future Appointments             In 1 month Justus Leita DEL, MD [facility] Primary Care & Sports Medicine at [facility], [facility]             metFORMIN  (GLUCOPHAGE ) 1000 MG tablet [Pharmacy Med Name: METFORMIN  HCL 1,000 MG TABLET] 180 tablet 0    Sig: TAKE 1 TABLET BY MOUTH TWICE A DAY     Endocrinology:  Diabetes - Biguanides Failed - 06/08/2023  7:13 PM      Failed - HBA1C is between 0 and 7.9 and within 180 days    Hgb A1c MFr Bld  Date Value Ref Range Status  03/09/2023 10.6 (H) 4.8 - 5.6 % Final    Comment:    (NOTE) Pre diabetes:          5.7%-6.4%  Diabetes:              >6.4%  Glycemic control for   <7.0% adults with diabetes  Failed - B12 Level in normal range and within 720 days    No results found for: VITAMINB12       Passed - Cr in normal range and within 360 days    Creatinine, Ser  Date Value Ref Range Status  03/09/2023 1.24 0.61 - 1.24 mg/dL Final         Passed - eGFR in normal range and within 360 days    GFR calc Af Amer  Date Value Ref Range Status  06/09/2018 >60 >60 mL/min Final   GFR, Estimated  Date Value Ref Range Status  03/09/2023 >60 >60 mL/min Final    Comment:    (NOTE) Calculated using the CKD-EPI Creatinine Equation (2021)    eGFR  Date Value Ref Range Status  08/25/2021 89 >59 mL/min/1.73 Final         Passed - Valid encounter within last 6 months    Recent Outpatient Visits           2 months ago Essential (primary) hypertension   [place] [facility] at [facility], Leita DEL, MD   3 months ago Type II diabetes mellitus with complication [facility])   [place] [facility] at [facility], Leita DEL, MD   11 months ago Type II diabetes mellitus with complication [facility])   [place] [facility] Medicine at [facility], Leita DEL, MD   1 year ago Type II diabetes mellitus with complication [facility])   [place]  Primary Care & Sports Medicine at [facility], Leita DEL, MD   1 year ago Essential (primary) hypertension   [place] [facility] Sports Medicine at [facility], Leita DEL, MD       Future Appointments             In 1 month Justus Leita DEL, MD [facility] Primary Care & Sports Medicine at [facility], [facility]            Passed - CBC within normal limits and completed in the last 12 months    WBC  Date Value Ref Range Status  03/09/2023 7.8 4.0 - 10.5 K/uL Final   RBC  Date Value Ref Range Status  03/09/2023 5.52 4.22 - 5.81 MIL/uL Final   Hemoglobin  Date Value Ref Range Status  03/09/2023 17.0 13.0 - 17.0 g/dL Final   HCT  Date Value Ref Range Status  03/09/2023 47.7 39.0 - 52.0 % Final   MCHC  Date Value Ref Range Status  03/09/2023 35.6 30.0 - 36.0 g/dL Final   [facility]  Date Value Ref Range Status  03/09/2023 30.8 26.0 - 34.0 pg Final   MCV  Date Value Ref Range Status  03/09/2023 86.4 80.0 - 100.0 fL Final   No results found for: PLTCOUNTKUC, LABPLAT, POCPLA RDW  Date Value Ref Range Status  03/09/2023 13.4 11.5 - 15.5 % Final          pioglitazone  (ACTOS ) 15 MG tablet [Pharmacy Med Name: PIOGLITAZONE  HCL 15 MG TABLET] 90 tablet 0    Sig: TAKE 1 TABLET (15 MG TOTAL) BY MOUTH DAILY.     Endocrinology:  Diabetes - Glitazones - pioglitazone  Failed - 06/08/2023  7:13 PM      Failed - HBA1C is between 0 and 7.9 and within 180 days    Hgb A1c MFr Bld  Date Value Ref Range Status  03/09/2023 10.6 (H) 4.8 - 5.6 %  Final    Comment:    (NOTE) Pre diabetes:          5.7%-6.4%  Diabetes:              >6.4%  Glycemic control for   <7.0% adults with diabetes          Passed - Valid encounter within last 6 months    Recent Outpatient Visits           2 months ago Essential (primary) hypertension   [place] [facility] at [facility], Leita DEL, MD   3 months ago Type II diabetes  mellitus with complication [facility])   [place] [facility] Medicine at [facility], Leita DEL, MD   11 months ago Type II diabetes mellitus with complication [facility])   [place] [facility] Medicine at [facility], Leita DEL, MD   1 year ago Type II diabetes mellitus with complication [facility])   [place] [facility] at [facility], Leita DEL, MD   1 year ago Essential (primary) hypertension   [place] [facility] at [facility], Leita DEL, MD       Future Appointments             In 1 month Justus, Leita DEL, MD [facility] Primary Care & Sports Medicine at [facility], [facility]             glipiZIDE  (GLUCOTROL ) 5 MG tablet [Pharmacy Med Name: GLIPIZIDE  5 MG TABLET] 180 tablet 0    Sig: TAKE 1 TABLET (5 MG TOTAL) BY MOUTH TWICE A DAY BEFORE MEALS     Endocrinology:  Diabetes - Sulfonylureas Failed - 06/08/2023  7:13 PM      Failed - HBA1C is between 0 and 7.9 and within 180 days    Hgb A1c MFr Bld  Date Value Ref Range Status  03/09/2023 10.6 (H) 4.8 - 5.6 % Final    Comment:    (NOTE) Pre diabetes:          5.7%-6.4%  Diabetes:              >6.4%  Glycemic control for   <7.0% adults with diabetes          Passed - Cr in normal range and within 360 days    Creatinine, Ser  Date Value Ref Range Status  03/09/2023 1.24 0.61 - 1.24 mg/dL Final         Passed - Valid encounter within last 6 months    Recent Outpatient Visits           2 months ago Essential (primary) hypertension   [place] [facility] at [facility], Leita DEL, MD   3 months ago Type II diabetes mellitus with complication [facility])   [place] [facility] at [facility], Leita DEL, MD   11 months ago Type II diabetes mellitus with complication [facility])   [place] [facility] Care [facility] Sports Medicine at [facility], Leita DEL, MD   1 year ago Type II diabetes mellitus with complication [facility])   [place] [facility] at [facility], Leita DEL, MD   1 year ago Essential (primary) hypertension   [facility] at [facility], Leita DEL, MD  Future Appointments             In 1 month Justus, Leita DEL, MD [facility] Primary Care & Sports Medicine at [facility], [facility]

## 2023-07-16 ENCOUNTER — Ambulatory Visit (INDEPENDENT_AMBULATORY_CARE_PROVIDER_SITE_OTHER): Payer: Self-pay | Admitting: Internal Medicine

## 2023-07-16 ENCOUNTER — Encounter: Payer: Self-pay | Admitting: Internal Medicine

## 2023-07-16 ENCOUNTER — Other Ambulatory Visit
Admission: RE | Admit: 2023-07-16 | Discharge: 2023-07-16 | Disposition: A | Discharge status: Home / Self Care | Payer: Self-pay | Attending: Internal Medicine | Admitting: Internal Medicine

## 2023-07-16 VITALS — BP 102/68 | HR 84 | Ht 63.0 in | Wt 130.0 lb

## 2023-07-16 DIAGNOSIS — E785 Hyperlipidemia, unspecified: Secondary | ICD-10-CM

## 2023-07-16 DIAGNOSIS — Z7984 Long term (current) use of oral hypoglycemic drugs: Secondary | ICD-10-CM

## 2023-07-16 DIAGNOSIS — I10 Essential (primary) hypertension: Secondary | ICD-10-CM

## 2023-07-16 DIAGNOSIS — E118 Type 2 diabetes mellitus with unspecified complications: Secondary | ICD-10-CM | POA: Insufficient documentation

## 2023-07-16 DIAGNOSIS — E1169 Type 2 diabetes mellitus with other specified complication: Secondary | ICD-10-CM

## 2023-07-16 LAB — COMPREHENSIVE METABOLIC PANEL WITH GFR
ALT: 14 U/L (ref 0–44)
AST: 14 U/L — ABNORMAL LOW (ref 15–41)
Albumin: 4.3 g/dL (ref 3.5–5.0)
Alkaline Phosphatase: 117 U/L (ref 38–126)
Anion gap: 9 (ref 5–15)
BUN: 24 mg/dL — ABNORMAL HIGH (ref 8–23)
CO2: 24 mmol/L (ref 22–32)
Calcium: 9.5 mg/dL (ref 8.9–10.3)
Chloride: 100 mmol/L (ref 98–111)
Creatinine, Ser: 1.15 mg/dL (ref 0.61–1.24)
GFR, Estimated: >60 mL/min
Glucose, Bld: 162 mg/dL — ABNORMAL HIGH (ref 70–99)
Potassium: 4.5 mmol/L (ref 3.5–5.1)
Sodium: 133 mmol/L — ABNORMAL LOW (ref 135–145)
Total Bilirubin: 0.8 mg/dL (ref 0.0–1.2)
Total Protein: 7.9 g/dL (ref 6.5–8.1)

## 2023-07-16 LAB — HEMOGLOBIN A1C
Hgb A1c MFr Bld: 8 % — ABNORMAL HIGH (ref 4.8–5.6)
Mean Plasma Glucose: 182.9 mg/dL

## 2023-07-16 MED ORDER — SIMVASTATIN 40 MG PO TABS: 40.0000 mg | ORAL_TABLET | Freq: Every day | ORAL | 1 refills | Status: DC

## 2023-07-16 MED ORDER — LISINOPRIL 20 MG PO TABS: 20.0000 mg | ORAL_TABLET | Freq: Every day | ORAL | 1 refills | Status: DC

## 2023-07-16 NOTE — Assessment & Plan Note (Addendum)
Blood sugars stable without hypoglycemic symptoms or events. Currently managed with Jardiance, metformin, Actos and glucotrol. Changes made last visit are adding Jardiance. 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

## 2023-07-16 NOTE — Assessment & Plan Note (Signed)
Controlled BP with normal exam. Current regimen is lisinopril. Will continue same medications; encourage continued reduced sodium diet.

## 2023-07-16 NOTE — Progress Notes (Signed)
Date:  07/16/2023   Name:  Daniel Bell   DOB:  [birth year]   MRN:  [id number]   Chief Complaint: Diabetes and Hypertension  Diabetes He presents for his follow-up diabetic visit. He has type 2 diabetes mellitus. His disease course has been improving. Pertinent negatives for hypoglycemia include no headaches or tremors. Pertinent negatives for diabetes include no chest pain, no fatigue, no polydipsia and no polyuria.  Hypertension This is a chronic problem. The problem is controlled. Pertinent negatives include no chest pain, headaches, palpitations or shortness of breath. Past treatments include ACE inhibitors.    Review of Systems  Constitutional:  Negative for appetite change, fatigue and unexpected weight change.  Eyes:  Negative for visual disturbance.  Respiratory:  Negative for cough, shortness of breath and wheezing.   Cardiovascular:  Negative for chest pain, palpitations and leg swelling.  Gastrointestinal:  Negative for abdominal pain and blood in stool.  Endocrine: Negative for polydipsia and polyuria.  Genitourinary:  Negative for dysuria and hematuria.  Skin:  Negative for color change and rash.  Neurological:  Negative for tremors, numbness and headaches.  Psychiatric/Behavioral:  Negative for dysphoric mood.      Lab Results  Component Value Date   NA 135 03/09/2023   K 4.1 03/09/2023   CO2 21 (L) 03/09/2023   GLUCOSE 233 (H) 03/09/2023   BUN 16 03/09/2023   CREATININE 1.24 03/09/2023   CALCIUM 8.8 (L) 03/09/2023   EGFR 89 08/25/2021   GFRNONAA >60 03/09/2023   Lab Results  Component Value Date   CHOL 102 03/09/2023   HDL 33 (L) 03/09/2023   LDLCALC 18 03/09/2023   TRIG 253 (H) 03/09/2023   CHOLHDL 3.1 03/09/2023   No results found for: "TSH" Lab Results  Component Value Date   HGBA1C 10.6 (H) 03/09/2023   Lab Results  Component Value Date   WBC 7.8 03/09/2023   HGB 17.0 03/09/2023   HCT 47.7 03/09/2023   MCV 86.4 03/09/2023   PLT 216 03/09/2023    Lab Results  Component Value Date   ALT 12 03/09/2023   AST 20 03/09/2023   ALKPHOS 120 03/09/2023   BILITOT 0.5 03/09/2023   No results found for: "25OHVITD2", "25OHVITD3", "VD25OH"   Patient Active Problem List   Diagnosis Date Noted   Encounter for screening colonoscopy    Pain of right hip joint 08/25/2017   Hematuria, microscopic 08/25/2017   Cervical stenosis of spinal canal 12/10/2015   Neural foraminal stenosis of cervical spine 12/10/2015   Cervical radiculopathy 11/22/2015   Type II diabetes mellitus with complication (HCC) 11/22/2014   Essential (primary) hypertension 11/22/2014   Glaucoma 11/22/2014   Bilateral hearing loss 11/22/2014   Hyperlipidemia associated with type 2 diabetes mellitus (HCC) 11/22/2014    Allergies  Allergen Reactions   Trulicity [Dulaglutide] Diarrhea    Past Surgical History:  Procedure Laterality Date   COLONOSCOPY WITH PROPOFOL N/A 07/04/2018   Procedure: COLONOSCOPY WITH PROPOFOL;  Surgeon: Toney Reil, MD;  Location: [facility] ENDOSCOPY;  Service: Gastroenterology;  Laterality: N/A;   HIP SURGERY Right    as a baby for leg length discrepancy    Social History   Tobacco Use   Smoking status: Former    Current packs/day: 0.50    Average packs/day: 0.5 packs/day for 35.9 years (18.0 ttl pk-yrs)    Types: Cigars, Cigarettes    Start date: 08/12/1987   Smokeless tobacco: Never   Tobacco comments:    3-5 cigars a  week since stopping cigarettes  Vaping Use   Vaping status: Never Used  Substance Use Topics   Alcohol use: No    Alcohol/week: 0.0 standard drinks of alcohol   Drug use: No     Medication list has been reviewed and updated.  Current Meds  Medication Sig   Accu-Chek FastClix Lancets MISC USE AS DIRECTED 4 TIMES A DAY   baclofen (LIORESAL) 10 MG tablet TAKE 1 TABLET BY MOUTH THREE TIMES A DAY   blood glucose meter kit and supplies KIT Dispense based on patient and insurance preference. Use up to four times  daily as directed. (FOR ICD-9 250.00, 250.01).   glipiZIDE (GLUCOTROL) 5 MG tablet TAKE 1 TABLET (5 MG TOTAL) BY MOUTH TWICE A DAY BEFORE MEALS   JARDIANCE 25 MG TABS tablet TAKE 1 TABLET BY MOUTH DAILY BEFORE BREAKFAST.   meloxicam (MOBIC) 15 MG tablet TAKE 1 TABLET BY MOUTH EVERY DAY AS NEEDED FOR PAIN   metFORMIN (GLUCOPHAGE) 1000 MG tablet TAKE 1 TABLET BY MOUTH TWICE A DAY   Multiple Vitamins-Minerals (MULTIVITAMIN WITH MINERALS) tablet Take 1 tablet by mouth daily.   nystatin-triamcinolone ointment (MYCOLOG) APPLY 1 APPLICATION TOPICALLY 2 (TWO) TIMES DAILY. TO GROIN RASH   pioglitazone (ACTOS) 15 MG tablet TAKE 1 TABLET (15 MG TOTAL) BY MOUTH DAILY.   [DISCONTINUED] lisinopril (ZESTRIL) 20 MG tablet Take 1 tablet (20 mg total) by mouth daily.   [DISCONTINUED] simvastatin (ZOCOR) 40 MG tablet Take 1 tablet (40 mg total) by mouth daily.       03/29/2023    8:53 AM 03/09/2023    2:33 PM 02/27/2022   11:08 AM 01/27/2022    8:35 AM  GAD 7 : Generalized Anxiety Score  Nervous, Anxious, on Edge 0 0 0 0  Control/stop worrying 0 0 0 0  Worry too much - different things 0 0 0 0  Trouble relaxing 0 0 0 0  Restless 0 0 0 0  Easily annoyed or irritable 0 0 0 0  Afraid - awful might happen 0 0 0 0  Total GAD 7 Score 0 0 0 0  Anxiety Difficulty Not difficult at all Not difficult at all Not difficult at all Not difficult at all       03/29/2023    8:53 AM 03/09/2023    2:32 PM 02/27/2022   11:07 AM  Depression screen PHQ 2/9  Decreased Interest 0 0 0  Down, Depressed, Hopeless 0 0 0  PHQ - 2 Score 0 0 0  Altered sleeping 0 0 0  Tired, decreased energy 0 1 0  Change in appetite 0 0 0  Feeling bad or failure about yourself  0 0 0  Trouble concentrating 0 0 0  Moving slowly or fidgety/restless 0 0 0  Suicidal thoughts 0  0  PHQ-9 Score 0 1 0  Difficult doing work/chores Not difficult at all Not difficult at all Not difficult at all    BP Readings from Last 3 Encounters:  07/16/23  102/68  03/29/23 112/70  03/09/23 132/70    Physical Exam Vitals and nursing note reviewed.  Constitutional:      General: He is not in acute distress.    Appearance: He is well-developed.  HENT:     Head: Normocephalic and atraumatic.  Cardiovascular:     Rate and Rhythm: Normal rate and regular rhythm.  Pulmonary:     Effort: Pulmonary effort is normal. No respiratory distress.     Breath sounds: No wheezing  or rhonchi.  Musculoskeletal:     Cervical back: Normal range of motion.     Right lower leg: No edema.     Left lower leg: No edema.  Skin:    General: Skin is warm and dry.     Findings: No rash.  Neurological:     Mental Status: He is alert and oriented to person, place, and time.  Psychiatric:        Mood and Affect: Mood normal.        Behavior: Behavior normal.     Wt Readings from Last 3 Encounters:  07/16/23 130 lb (59 kg)  03/29/23 132 lb (59.9 kg)  03/09/23 133 lb (60.3 kg)    BP 102/68   Pulse 84   Ht 5\' 3"  (1.6 m)   Wt 130 lb (59 kg)   SpO2 97%   BMI 23.03 kg/m   Assessment and Plan:  Problem List Items Addressed This Visit       Unprioritized   Type II diabetes mellitus with complication (HCC) - Primary (Chronic)   Blood sugars stable without hypoglycemic symptoms or events. Currently managed with Jardiance, metformin, Actos and glucotrol. Changes made last visit are adding Jardiance. 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       Relevant Medications   lisinopril (ZESTRIL) 20 MG tablet   simvastatin (ZOCOR) 40 MG tablet   Other Relevant Orders   Comprehensive metabolic panel   Hemoglobin A1c   Essential (primary) hypertension (Chronic)   Controlled BP with normal exam. Current regimen is lisinopril. Will continue same medications; encourage continued reduced sodium diet.       Relevant Medications   lisinopril (ZESTRIL) 20 MG tablet    simvastatin (ZOCOR) 40 MG tablet   Hyperlipidemia associated with type 2 diabetes mellitus (HCC) (Chronic)   Relevant Medications   lisinopril (ZESTRIL) 20 MG tablet   simvastatin (ZOCOR) 40 MG tablet   Other Visit Diagnoses       Long term current use of oral hypoglycemic drug           Return in about 4 months (around 11/13/2023) for DM, HTN.    Reubin Milan, MD [facility] Medicine [place]

## 2023-07-22 ENCOUNTER — Telehealth: Payer: Self-pay

## 2023-07-22 NOTE — Telephone Encounter (Signed)
Faxed labs to email below.  - Breda Bond       Copied from CRM [id number]. Topic: General - Other >> Jul 21, 2023 10:00 AM Antony Haste wrote: Reason for CRM: Patient is requesting his most recent A1C results to be sent to: [facility] - [address]. 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 Email: [email or web address]

## 2023-09-03 ENCOUNTER — Other Ambulatory Visit: Payer: Self-pay | Admitting: Internal Medicine

## 2023-09-03 NOTE — Telephone Encounter (Signed)
 Requested Prescriptions  Pending Prescriptions Disp Refills   pioglitazone (ACTOS) 15 MG tablet [Pharmacy Med Name: PIOGLITAZONE HCL 15 MG TABLET] 90 tablet 0    Sig: TAKE 1 TABLET (15 MG TOTAL) BY MOUTH DAILY.     Endocrinology:  Diabetes - Glitazones - pioglitazone Failed - 09/03/2023  2:46 PM      Failed - HBA1C is between 0 and 7.9 and within 180 days    Hgb A1c MFr Bld  Date Value Ref Range Status  07/16/2023 8.0 (H) 4.8 - 5.6 % Final    Comment:    (NOTE) Pre diabetes:          5.7%-6.4%  Diabetes:              >6.4%  Glycemic control for   <7.0% adults with diabetes          Passed - Valid encounter within last 6 months    Recent Outpatient Visits           1 month ago Type II diabetes mellitus with complication [facility])   [place] Primary [facility] Medicine at [facility], Nyoka Cowden, MD       Future Appointments             In 2 months Judithann Graves Nyoka Cowden, MD [facility] at [facility] [place]             glipiZIDE (GLUCOTROL) 5 MG tablet [Pharmacy Med Name: GLIPIZIDE 5 MG TABLET] 180 tablet 0    Sig: TAKE 1 TABLET (5 MG TOTAL) BY MOUTH TWICE A DAY BEFORE MEALS     Endocrinology:  Diabetes - Sulfonylureas Failed - 09/03/2023  2:46 PM      Failed - HBA1C is between 0 and 7.9 and within 180 days    Hgb A1c MFr Bld  Date Value Ref Range Status  07/16/2023 8.0 (H) 4.8 - 5.6 % Final    Comment:    (NOTE) Pre diabetes:          5.7%-6.4%  Diabetes:              >6.4%  Glycemic control for   <7.0% adults with diabetes          Passed - Cr in normal range and within 360 days    Creatinine, Ser  Date Value Ref Range Status  07/16/2023 1.15 0.61 - 1.24 mg/dL Final         Passed - Valid encounter within last 6 months    Recent Outpatient Visits           1 month ago Type II diabetes mellitus with complication [facility])   [place] Primary Care & Sports Medicine at [facility], Nyoka Cowden, MD        Future Appointments             In 2 months Judithann Graves Nyoka Cowden, MD [facility] Primary [facility] & [facility] Medicine at [facility] [place], [facility]             metFORMIN (GLUCOPHAGE) 1000 MG tablet [Pharmacy Med Name: METFORMIN HCL 1,000 MG TABLET] 180 tablet 0    Sig: TAKE 1 TABLET BY MOUTH TWICE A DAY     Endocrinology:  Diabetes - Biguanides Failed - 09/03/2023  2:46 PM      Failed - HBA1C is between 0 and 7.9 and within 180 days    Hgb A1c MFr Bld  Date Value Ref Range  Status  07/16/2023 8.0 (H) 4.8 - 5.6 % Final    Comment:    (NOTE) Pre diabetes:          5.7%-6.4%  Diabetes:              >6.4%  Glycemic control for   <7.0% adults with diabetes          Failed - B12 Level in normal range and within 720 days    No results found for: "VITAMINB12"       Passed - Cr in normal range and within 360 days    Creatinine, Ser  Date Value Ref Range Status  07/16/2023 1.15 0.61 - 1.24 mg/dL Final         Passed - eGFR in normal range and within 360 days    GFR calc Af Amer  Date Value Ref Range Status  06/09/2018 >60 >60 mL/min Final   GFR, Estimated  Date Value Ref Range Status  07/16/2023 >60 >60 mL/min Final    Comment:    (NOTE) Calculated using the CKD-EPI Creatinine Equation (2021)    eGFR  Date Value Ref Range Status  08/25/2021 89 >59 mL/min/1.73 Final         Passed - Valid encounter within last 6 months    Recent Outpatient Visits           1 month ago Type II diabetes mellitus with complication [facility])   [place] Primary Care & Sports Medicine at [facility], Nyoka Cowden, MD       Future Appointments             In 2 months Reubin Milan, MD [facility] Primary Care & Sports Medicine at [facility]            Passed - CBC within normal limits and completed in the last 12 months    WBC  Date Value Ref Range Status  03/09/2023 7.8 4.0 - 10.5 K/uL Final   RBC  Date Value Ref Range Status  03/09/2023 5.52 4.22 - 5.81  MIL/uL Final   Hemoglobin  Date Value Ref Range Status  03/09/2023 17.0 13.0 - 17.0 g/dL Final   HCT  Date Value Ref Range Status  03/09/2023 47.7 39.0 - 52.0 % Final   MCHC  Date Value Ref Range Status  03/09/2023 35.6 30.0 - 36.0 g/dL Final   [facility]  Date Value Ref Range Status  03/09/2023 30.8 26.0 - 34.0 pg Final   MCV  Date Value Ref Range Status  03/09/2023 86.4 80.0 - 100.0 fL Final   No results found for: "PLTCOUNTKUC", "LABPLAT", "POCPLA" RDW  Date Value Ref Range Status  03/09/2023 13.4 11.5 - 15.5 % Final

## 2023-09-04 ENCOUNTER — Other Ambulatory Visit: Payer: Self-pay | Admitting: Internal Medicine

## 2023-09-04 DIAGNOSIS — E118 Type 2 diabetes mellitus with unspecified complications: Secondary | ICD-10-CM

## 2023-09-06 NOTE — Telephone Encounter (Signed)
 Requested Prescriptions  Pending Prescriptions Disp Refills   JARDIANCE 25 MG TABS tablet [Pharmacy Med Name: JARDIANCE 25 MG TABLET] 90 tablet 0    Sig: TAKE 1 TABLET BY MOUTH DAILY BEFORE BREAKFAST.     Endocrinology:  Diabetes - SGLT2 Inhibitors Failed - 09/06/2023  1:39 PM      Failed - HBA1C is between 0 and 7.9 and within 180 days    Hgb A1c MFr Bld  Date Value Ref Range Status  07/16/2023 8.0 (H) 4.8 - 5.6 % Final    Comment:    (NOTE) Pre diabetes:          5.7%-6.4%  Diabetes:              >6.4%  Glycemic control for   <7.0% adults with diabetes          Passed - Cr in normal range and within 360 days    Creatinine, Ser  Date Value Ref Range Status  07/16/2023 1.15 0.61 - 1.24 mg/dL Final         Passed - eGFR in normal range and within 360 days    GFR calc Af Amer  Date Value Ref Range Status  06/09/2018 >60 >60 mL/min Final   GFR, Estimated  Date Value Ref Range Status  07/16/2023 >60 >60 mL/min Final    Comment:    (NOTE) Calculated using the CKD-EPI Creatinine Equation (2021)    eGFR  Date Value Ref Range Status  08/25/2021 89 >59 mL/min/1.73 Final         Passed - Valid encounter within last 6 months    Recent Outpatient Visits           1 month ago Type II diabetes mellitus with complication [facility])   [place] Primary Care & [facility] Medicine at [facility], Nyoka Cowden, MD       Future Appointments             In 2 months Judithann Graves Nyoka Cowden, MD [facility] Primary Care & Sports Medicine at [facility], [facility]

## 2023-11-15 ENCOUNTER — Ambulatory Visit (INDEPENDENT_AMBULATORY_CARE_PROVIDER_SITE_OTHER): Payer: Self-pay | Admitting: Internal Medicine

## 2023-11-15 VITALS — BP 118/74 | HR 88 | Ht 63.0 in | Wt 128.6 lb

## 2023-11-15 DIAGNOSIS — N529 Male erectile dysfunction, unspecified: Secondary | ICD-10-CM

## 2023-11-15 DIAGNOSIS — Z7984 Long term (current) use of oral hypoglycemic drugs: Secondary | ICD-10-CM

## 2023-11-15 DIAGNOSIS — E118 Type 2 diabetes mellitus with unspecified complications: Secondary | ICD-10-CM

## 2023-11-15 DIAGNOSIS — I10 Essential (primary) hypertension: Secondary | ICD-10-CM

## 2023-11-15 LAB — POCT GLYCOSYLATED HEMOGLOBIN (HGB A1C): Hemoglobin A1C: 7.7 % — AB (ref 4.0–5.6)

## 2023-11-15 MED ORDER — METFORMIN HCL 1000 MG PO TABS: 1000.0000 mg | ORAL_TABLET | Freq: Two times a day (BID) | ORAL | 1 refills | Status: DC

## 2023-11-15 MED ORDER — PIOGLITAZONE HCL 15 MG PO TABS: 15.0000 mg | ORAL_TABLET | Freq: Every day | ORAL | 1 refills | Status: DC

## 2023-11-15 MED ORDER — SILDENAFIL CITRATE 100 MG PO TABS: 50.0000 mg | ORAL_TABLET | Freq: Every day | ORAL | 0 refills | Status: DC | PRN

## 2023-11-15 MED ORDER — EMPAGLIFLOZIN 25 MG PO TABS: 25.0000 mg | ORAL_TABLET | Freq: Every day | ORAL | 1 refills | Status: DC

## 2023-11-15 MED ORDER — GLIPIZIDE 5 MG PO TABS
5.0000 mg | ORAL_TABLET | Freq: Two times a day (BID) | ORAL | 1 refills | Status: AC
Start: 2023-11-15 — End: ?

## 2023-11-15 NOTE — Assessment & Plan Note (Addendum)
 Blood sugars have been stable.  No recent hypoglycemic events requiring assistance. Currently medications are Jardiance , glipizide , MTF and Actos . Lab Results  Component Value Date   HGBA1C 8.0 (H) 07/16/2023   Last visit no changes were made. A1c today = 7.7 He is reminded to schedule his eye exam.

## 2023-11-15 NOTE — Progress Notes (Signed)
 Date:  11/15/2023   Name:  Daniel Bell   DOB:  19-May-1961   MRN:  [id number]   Chief Complaint: Diabetes and Hypertension  Hypertension This is a chronic problem. The problem is controlled. Pertinent negatives include no chest pain, headaches, palpitations or shortness of breath.  Diabetes He presents for his follow-up diabetic visit. He has type 2 diabetes mellitus. Pertinent negatives for hypoglycemia include no headaches, nervousness/anxiousness or tremors. Pertinent negatives for diabetes include no chest pain, no fatigue, no polydipsia and no polyuria. Current diabetic treatments: Jardiance , glipizide , MTF and Actos .  Erectile Dysfunction This is a new problem. The problem is unchanged. The nature of his difficulty is maintaining erection. He reports no anxiety. He reports his erection duration to be 1 to 5 minutes. Irritative symptoms do not include frequency, nocturia or urgency. Pertinent negatives include no dysuria or hematuria.    Review of Systems  Constitutional:  Negative for appetite change, fatigue and unexpected weight change.  Eyes:  Negative for visual disturbance.  Respiratory:  Negative for cough, shortness of breath and wheezing.   Cardiovascular:  Negative for chest pain, palpitations and leg swelling.  Gastrointestinal:  Negative for abdominal pain and blood in stool.  Endocrine: Negative for polydipsia and polyuria.  Genitourinary:  Negative for dysuria, frequency, hematuria, nocturia and urgency.  Skin:  Negative for color change and rash.  Neurological:  Negative for tremors, numbness and headaches.  Psychiatric/Behavioral:  Negative for dysphoric mood and sleep disturbance. The patient is not nervous/anxious.      Lab Results  Component Value Date   NA 133 (L) 07/16/2023   K 4.5 07/16/2023   CO2 24 07/16/2023   GLUCOSE 162 (H) 07/16/2023   BUN 24 (H) 07/16/2023   CREATININE 1.15 07/16/2023   CALCIUM 9.5 07/16/2023   EGFR 89 08/25/2021   GFRNONAA >60  07/16/2023   Lab Results  Component Value Date   CHOL 102 03/09/2023   HDL 33 (L) 03/09/2023   LDLCALC 18 03/09/2023   TRIG 253 (H) 03/09/2023   CHOLHDL 3.1 03/09/2023   No results found for: TSH Lab Results  Component Value Date   HGBA1C 7.7 (A) 11/15/2023   Lab Results  Component Value Date   WBC 7.8 03/09/2023   HGB 17.0 03/09/2023   HCT 47.7 03/09/2023   MCV 86.4 03/09/2023   PLT 216 03/09/2023   Lab Results  Component Value Date   ALT 14 07/16/2023   AST 14 (L) 07/16/2023   ALKPHOS 117 07/16/2023   BILITOT 0.8 07/16/2023   No results found for: Lucetta Russel, VD25OH   Patient Active Problem List   Diagnosis Date Noted   Encounter for screening colonoscopy    Pain of right hip joint 08/25/2017   Hematuria, microscopic 08/25/2017   Cervical stenosis of spinal canal 12/10/2015   Neural foraminal stenosis of cervical spine 12/10/2015   Cervical radiculopathy 11/22/2015   Type II diabetes mellitus with complication (HCC) 11/22/2014   Essential (primary) hypertension 11/22/2014   Glaucoma 11/22/2014   Bilateral hearing loss 11/22/2014   Hyperlipidemia associated with type 2 diabetes mellitus (HCC) 11/22/2014    Allergies  Allergen Reactions   Trulicity  [Dulaglutide ] Diarrhea    Past Surgical History:  Procedure Laterality Date   COLONOSCOPY WITH PROPOFOL  N/A 07/04/2018   Procedure: COLONOSCOPY WITH PROPOFOL ;  Surgeon: Selena Daily, MD;  Location: [facility] ENDOSCOPY;  Service: Gastroenterology;  Laterality: N/A;   HIP SURGERY Right    as a baby for leg length  discrepancy    Social History   Tobacco Use   Smoking status: Every Day    Current packs/day: 0.50    Average packs/day: 0.5 packs/day for 36.3 years (18.1 ttl pk-yrs)    Types: Cigars, Cigarettes    Start date: 08/12/1987   Smokeless tobacco: Never   Tobacco comments:    3-5 cigars a week since stopping cigarettes  Vaping Use   Vaping status: Never Used  Substance Use Topics    Alcohol use: No    Alcohol/week: 0.0 standard drinks of alcohol   Drug use: No     Medication list has been reviewed and updated.  Current Meds  Medication Sig   Accu-Chek FastClix Lancets MISC USE AS DIRECTED 4 TIMES A DAY   baclofen  (LIORESAL ) 10 MG tablet TAKE 1 TABLET BY MOUTH THREE TIMES A DAY   blood glucose meter kit and supplies KIT Dispense based on patient and insurance preference. Use up to four times daily as directed. (FOR ICD-9 250.00, 250.01).   lisinopril  (ZESTRIL ) 20 MG tablet Take 1 tablet (20 mg total) by mouth daily.   meloxicam  (MOBIC ) 15 MG tablet TAKE 1 TABLET BY MOUTH EVERY DAY AS NEEDED FOR PAIN   Multiple Vitamins-Minerals (MULTIVITAMIN WITH MINERALS) tablet Take 1 tablet by mouth daily.   nystatin -triamcinolone  ointment (MYCOLOG) APPLY 1 APPLICATION TOPICALLY 2 (TWO) TIMES DAILY. TO GROIN RASH   sildenafil (VIAGRA) 100 MG tablet Take 0.5-1 tablets (50-100 mg total) by mouth daily as needed for erectile dysfunction.   simvastatin  (ZOCOR ) 40 MG tablet Take 1 tablet (40 mg total) by mouth daily.   [DISCONTINUED] glipiZIDE  (GLUCOTROL ) 5 MG tablet TAKE 1 TABLET (5 MG TOTAL) BY MOUTH TWICE A DAY BEFORE MEALS   [DISCONTINUED] JARDIANCE  25 MG TABS tablet TAKE 1 TABLET BY MOUTH DAILY BEFORE BREAKFAST.   [DISCONTINUED] metFORMIN  (GLUCOPHAGE ) 1000 MG tablet TAKE 1 TABLET BY MOUTH TWICE A DAY   [DISCONTINUED] pioglitazone  (ACTOS ) 15 MG tablet TAKE 1 TABLET (15 MG TOTAL) BY MOUTH DAILY.       11/15/2023    8:59 AM 03/29/2023    8:53 AM 03/09/2023    2:33 PM 02/27/2022   11:08 AM  GAD 7 : Generalized Anxiety Score  Nervous, Anxious, on Edge 0 0 0 0  Control/stop worrying 0 0 0 0  Worry too much - different things 0 0 0 0  Trouble relaxing 0 0 0 0  Restless 0 0 0 0  Easily annoyed or irritable 0 0 0 0  Afraid - awful might happen 0 0 0 0  Total GAD 7 Score 0 0 0 0  Anxiety Difficulty Not difficult at all Not difficult at all Not difficult at all Not difficult at  all       11/15/2023    8:59 AM 03/29/2023    8:53 AM 03/09/2023    2:32 PM  Depression screen PHQ 2/9  Decreased Interest 0 0 0  Down, Depressed, Hopeless 0 0 0  PHQ - 2 Score 0 0 0  Altered sleeping 0 0 0  Tired, decreased energy 0 0 1  Change in appetite 0 0 0  Feeling bad or failure about yourself  0 0 0  Trouble concentrating 0 0 0  Moving slowly or fidgety/restless 0 0 0  Suicidal thoughts 0 0   PHQ-9 Score 0 0 1  Difficult doing work/chores Not difficult at all Not difficult at all Not difficult at all    BP Readings from Last 3 Encounters:  11/15/23 118/74  07/16/23 102/68  03/29/23 112/70    Physical Exam Vitals and nursing note reviewed.  Constitutional:      Appearance: Normal appearance. He is well-developed.  HENT:     Head: Normocephalic.     Right Ear: Tympanic membrane, ear canal and external ear normal.     Left Ear: Tympanic membrane, ear canal and external ear normal.     Nose: Nose normal.   Eyes:     Conjunctiva/sclera: Conjunctivae normal.     Pupils: Pupils are equal, round, and reactive to light.   Neck:     Thyroid: No thyromegaly.     Vascular: No carotid bruit.   Cardiovascular:     Rate and Rhythm: Normal rate and regular rhythm.     Heart sounds: Normal heart sounds.  Pulmonary:     Effort: Pulmonary effort is normal.     Breath sounds: Normal breath sounds. No wheezing.  Chest:  Breasts:    Right: No mass.     Left: No mass.  Abdominal:     General: Bowel sounds are normal.     Palpations: Abdomen is soft.     Tenderness: There is no abdominal tenderness.   Musculoskeletal:        General: Normal range of motion.     Cervical back: Normal range of motion and neck supple.     Right lower leg: No edema.     Left lower leg: No edema.  Lymphadenopathy:     Cervical: No cervical adenopathy.   Skin:    General: Skin is warm and dry.   Neurological:     Mental Status: He is alert and oriented to person, place, and time.  Mental status is at baseline.     Motor: Weakness present.     Gait: Gait abnormal.     Deep Tendon Reflexes: Reflexes are normal and symmetric.   Psychiatric:        Attention and Perception: Attention normal.        Mood and Affect: Mood normal.        Thought Content: Thought content normal.     Wt Readings from Last 3 Encounters:  11/15/23 128 lb 9.6 oz (58.3 kg)  07/16/23 130 lb (59 kg)  03/29/23 132 lb (59.9 kg)    BP 118/74   Pulse 88   Ht 5' 3 (1.6 m)   Wt 128 lb 9.6 oz (58.3 kg)   SpO2 100%   BMI 22.78 kg/m   Assessment and Plan:  Problem List Items Addressed This Visit       Unprioritized   Type II diabetes mellitus with complication (HCC) (Chronic)   Blood sugars have been stable.  No recent hypoglycemic events requiring assistance. Currently medications are Jardiance , glipizide , MTF and Actos . Lab Results  Component Value Date   HGBA1C 8.0 (H) 07/16/2023   Last visit no changes were made. A1c today = 7.7 He is reminded to schedule his eye exam.       Relevant Medications   pioglitazone  (ACTOS ) 15 MG tablet   metFORMIN  (GLUCOPHAGE ) 1000 MG tablet   glipiZIDE  (GLUCOTROL ) 5 MG tablet   empagliflozin  (JARDIANCE ) 25 MG TABS tablet   Other Relevant Orders   POCT glycosylated hemoglobin (Hb A1C) (Completed)   Essential (primary) hypertension - Primary (Chronic)   Blood pressure is well controlled.  Current medications are lisinopril . Will continue same regimen along with efforts to limit dietary sodium.       Relevant  Medications   sildenafil (VIAGRA) 100 MG tablet   Other Visit Diagnoses       Long term current use of oral hypoglycemic drug         Vasculogenic erectile dysfunction, unspecified vasculogenic erectile dysfunction type       Relevant Medications   sildenafil (VIAGRA) 100 MG tablet       Return in about 4 months (around 03/16/2024) for CPX, DM, HTN.    Sheron Dixons, MD [facility]  [place]

## 2023-11-15 NOTE — Assessment & Plan Note (Signed)
 Blood pressure is well controlled.  Current medications are lisinopril. Will continue same regimen along with efforts to limit dietary sodium.

## 2023-11-16 ENCOUNTER — Telehealth: Payer: Self-pay | Admitting: Internal Medicine

## 2023-11-16 NOTE — Telephone Encounter (Signed)
 Left voice mail to confirm change in his physical.

## 2023-12-06 ENCOUNTER — Other Ambulatory Visit: Payer: Self-pay | Admitting: Internal Medicine

## 2023-12-06 DIAGNOSIS — E1169 Type 2 diabetes mellitus with other specified complication: Secondary | ICD-10-CM

## 2023-12-08 NOTE — Telephone Encounter (Signed)
 Requested by interface surescripts. Future visit in 3 months  Requested Prescriptions  Pending Prescriptions Disp Refills   simvastatin  (ZOCOR ) 40 MG tablet [Pharmacy Med Name: SIMVASTATIN  40 MG TABLET] 90 tablet 1    Sig: TAKE 1 TABLET BY MOUTH EVERY DAY     Cardiovascular:  Antilipid - Statins Failed - 12/08/2023  1:49 PM      Failed - Lipid Panel in normal range within the last 12 months    Cholesterol, Total  Date Value Ref Range Status  10/16/2015 101 100 - 199 mg/dL Final   Cholesterol  Date Value Ref Range Status  03/09/2023 102 0 - 200 mg/dL Final   LDL Calculated  Date Value Ref Range Status  10/16/2015 41 0 - 99 mg/dL Final   LDL Cholesterol  Date Value Ref Range Status  03/09/2023 18 0 - 99 mg/dL Final    Comment:           Total Cholesterol/HDL:CHD Risk Coronary Heart Disease Risk Table                     Men   Women  1/2 Average Risk   3.4   3.3  Average Risk       5.0   4.4  2 X Average Risk   9.6   7.1  3 X Average Risk  23.4   11.0        Use the calculated Patient Ratio above and the CHD Risk Table to determine the patient's CHD Risk.        ATP III CLASSIFICATION (LDL):  <100     mg/dL   Optimal  899-870  mg/dL   Near or Above                    Optimal  130-159  mg/dL   Borderline  839-810  mg/dL   High  >809     mg/dL   Very High Performed at [facility], [address] [place], [place] [address]    HDL  Date Value Ref Range Status  03/09/2023 33 (L) >40 mg/dL Final  94/82/7982 37 (L) >39 mg/dL Final   Triglycerides  Date Value Ref Range Status  03/09/2023 253 (H) <150 mg/dL Final         Passed - Patient is not pregnant      Passed - Valid encounter within last 12 months    Recent Outpatient Visits           3 weeks ago Essential (primary) hypertension   [facility], Leita DEL, MD   4 months ago Type II diabetes mellitus with complication [facility])   [place]  [facility] at [facility], Leita DEL, MD       Future Appointments             In 3 months Justus, Leita DEL, MD [facility] at [facility], [facility]

## 2023-12-22 ENCOUNTER — Other Ambulatory Visit: Payer: Self-pay | Admitting: Internal Medicine

## 2023-12-22 ENCOUNTER — Telehealth: Payer: Self-pay

## 2023-12-22 DIAGNOSIS — E1169 Type 2 diabetes mellitus with other specified complication: Secondary | ICD-10-CM

## 2023-12-22 MED ORDER — ROSUVASTATIN CALCIUM 5 MG PO TABS: 5.0000 mg | ORAL_TABLET | Freq: Every day | ORAL | 1 refills | Status: AC

## 2023-12-22 NOTE — Telephone Encounter (Signed)
 Copied from CRM [id number]. Topic: Clinical - Prescription Issue >> Dec 22, 2023 11:24 AM Rosaria BRAVO wrote: Reason for CRM: Pt called to report that his simvastatin  (ZOCOR ) 40 MG tablet is no longer covered by his insurance. Says he has two bottles left. Pt says he needs something else called in for him.   Wants to speak to a nurse he says  Best contact: [phone]

## 2023-12-22 NOTE — Progress Notes (Unsigned)
 Date:  12/22/2023   Name:  Daniel Bell   DOB:  1961/05/04   MRN:  [id number]   Chief Complaint: No chief complaint on file.  HPI  Review of Systems   Lab Results  Component Value Date   NA 133 (L) 07/16/2023   K 4.5 07/16/2023   CO2 24 07/16/2023   GLUCOSE 162 (H) 07/16/2023   BUN 24 (H) 07/16/2023   CREATININE 1.15 07/16/2023   CALCIUM  9.5 07/16/2023   EGFR 89 08/25/2021   GFRNONAA >60 07/16/2023   Lab Results  Component Value Date   CHOL 102 03/09/2023   HDL 33 (L) 03/09/2023   LDLCALC 18 03/09/2023   TRIG 253 (H) 03/09/2023   CHOLHDL 3.1 03/09/2023   No results found for: TSH Lab Results  Component Value Date   HGBA1C 7.7 (A) 11/15/2023   Lab Results  Component Value Date   WBC 7.8 03/09/2023   HGB 17.0 03/09/2023   HCT 47.7 03/09/2023   MCV 86.4 03/09/2023   PLT 216 03/09/2023   Lab Results  Component Value Date   ALT 14 07/16/2023   AST 14 (L) 07/16/2023   ALKPHOS 117 07/16/2023   BILITOT 0.8 07/16/2023   No results found for: MARIEN BOLLS, VD25OH   Patient Active Problem List   Diagnosis Date Noted   Encounter for screening colonoscopy    Pain of right hip joint 08/25/2017   Hematuria, microscopic 08/25/2017   Cervical stenosis of spinal canal 12/10/2015   Neural foraminal stenosis of cervical spine 12/10/2015   Cervical radiculopathy 11/22/2015   Type II diabetes mellitus with complication (HCC) 11/22/2014   Essential (primary) hypertension 11/22/2014   Glaucoma 11/22/2014   Bilateral hearing loss 11/22/2014   Hyperlipidemia associated with type 2 diabetes mellitus (HCC) 11/22/2014    Allergies  Allergen Reactions   Trulicity  [Dulaglutide ] Diarrhea    Past Surgical History:  Procedure Laterality Date   COLONOSCOPY WITH PROPOFOL  N/A 07/04/2018   Procedure: COLONOSCOPY WITH PROPOFOL ;  Surgeon: Unk Corinn Skiff, MD;  Location: [facility] ENDOSCOPY;  Service: Gastroenterology;  Laterality: N/A;   HIP SURGERY Right    as a  baby for leg length discrepancy    Social History   Tobacco Use   Smoking status: Every Day    Current packs/day: 0.50    Average packs/day: 0.5 packs/day for 36.4 years (18.2 ttl pk-yrs)    Types: Cigars, Cigarettes    Start date: 08/12/1987   Smokeless tobacco: Never   Tobacco comments:    3-5 cigars a week since stopping cigarettes  Vaping Use   Vaping status: Never Used  Substance Use Topics   Alcohol use: No    Alcohol/week: 0.0 standard drinks of alcohol   Drug use: No     Medication list has been reviewed and updated.  No outpatient medications have been marked as taking for the 12/22/23 encounter (Orders Only) with Justus Leita DEL, MD.       11/15/2023    8:59 AM 03/29/2023    8:53 AM 03/09/2023    2:33 PM 02/27/2022   11:08 AM  GAD 7 : Generalized Anxiety Score  Nervous, Anxious, on Edge 0 0 0 0  Control/stop worrying 0 0 0 0  Worry too much - different things 0 0 0 0  Trouble relaxing 0 0 0 0  Restless 0 0 0 0  Easily annoyed or irritable 0 0 0 0  Afraid - awful might happen 0 0 0 0  Total GAD  7 Score 0 0 0 0  Anxiety Difficulty Not difficult at all Not difficult at all Not difficult at all Not difficult at all       11/15/2023    8:59 AM 03/29/2023    8:53 AM 03/09/2023    2:32 PM  Depression screen PHQ 2/9  Decreased Interest 0 0 0  Down, Depressed, Hopeless 0 0 0  PHQ - 2 Score 0 0 0  Altered sleeping 0 0 0  Tired, decreased energy 0 0 1  Change in appetite 0 0 0  Feeling bad or failure about yourself  0 0 0  Trouble concentrating 0 0 0  Moving slowly or fidgety/restless 0 0 0  Suicidal thoughts 0 0   PHQ-9 Score 0 0 1  Difficult doing work/chores Not difficult at all Not difficult at all Not difficult at all    BP Readings from Last 3 Encounters:  11/15/23 118/74  07/16/23 102/68  03/29/23 112/70    Physical Exam  Wt Readings from Last 3 Encounters:  11/15/23 128 lb 9.6 oz (58.3 kg)  07/16/23 130 lb (59 kg)  03/29/23 132 lb (59.9  kg)    There were no vitals taken for this visit.  Assessment and Plan:  Problem List Items Addressed This Visit   None   No follow-ups on file.    Leita HILARIO Adie, MD [facility] [place]

## 2023-12-22 NOTE — Telephone Encounter (Signed)
 Please review.  KP

## 2024-03-16 ENCOUNTER — Ambulatory Visit: Payer: Self-pay | Admitting: Internal Medicine

## 2024-03-21 ENCOUNTER — Ambulatory Visit (INDEPENDENT_AMBULATORY_CARE_PROVIDER_SITE_OTHER): Payer: Self-pay | Admitting: Internal Medicine

## 2024-03-21 ENCOUNTER — Encounter: Payer: Self-pay | Admitting: Internal Medicine

## 2024-03-21 VITALS — BP 128/78 | HR 78 | Ht 63.0 in | Wt 134.0 lb

## 2024-03-21 DIAGNOSIS — Z1211 Encounter for screening for malignant neoplasm of colon: Secondary | ICD-10-CM

## 2024-03-21 DIAGNOSIS — Z125 Encounter for screening for malignant neoplasm of prostate: Secondary | ICD-10-CM

## 2024-03-21 DIAGNOSIS — E785 Hyperlipidemia, unspecified: Secondary | ICD-10-CM

## 2024-03-21 DIAGNOSIS — E1169 Type 2 diabetes mellitus with other specified complication: Secondary | ICD-10-CM

## 2024-03-21 DIAGNOSIS — E118 Type 2 diabetes mellitus with unspecified complications: Secondary | ICD-10-CM

## 2024-03-21 DIAGNOSIS — Z0000 Encounter for general adult medical examination without abnormal findings: Secondary | ICD-10-CM

## 2024-03-21 DIAGNOSIS — N529 Male erectile dysfunction, unspecified: Secondary | ICD-10-CM

## 2024-03-21 DIAGNOSIS — Z7984 Long term (current) use of oral hypoglycemic drugs: Secondary | ICD-10-CM

## 2024-03-21 DIAGNOSIS — I10 Essential (primary) hypertension: Secondary | ICD-10-CM

## 2024-03-21 MED ORDER — SILDENAFIL CITRATE 100 MG PO TABS: 50.0000 mg | ORAL_TABLET | Freq: Every day | ORAL | 0 refills | Status: AC | PRN

## 2024-03-21 NOTE — Assessment & Plan Note (Signed)
 LDL is  Lab Results  Component Value Date   LDLCALC 18 03/09/2023    Current medication regimen is Crestor . Goal LDL is < 70.

## 2024-03-21 NOTE — Progress Notes (Signed)
 Date:  03/21/2024   Name:  Daniel Bell   DOB:  04/27/1961   MRN:  [id number]   Chief Complaint: Annual Exam Daniel Bell is a 63 y.o. male who presents today for his Complete Annual Exam. He feels fairly well. He reports exercising rarely. He reports he is sleeping fairly well. 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  Health Maintenance  Topic Date Due   HIV Screening  Never done   Zoster (Shingles) Vaccine (1 of 2) Never done   Eye exam for diabetics  11/24/2018   Colon Cancer Screening  07/04/2021   COVID-19 Vaccine (1 - 2025-26 season) Never done   Yearly kidney health urinalysis for diabetes  03/08/2024   Flu Shot  08/29/2024*   Hemoglobin A1C  05/16/2024   Yearly kidney function blood test for diabetes  07/15/2024   Complete foot exam   03/21/2025   DTaP/Tdap/Td vaccine (2 - Td or Tdap) 08/07/2025   Pneumococcal Vaccine for age over 20  Completed   Hepatitis C Screening  Completed   Hepatitis B Vaccine  Aged Out   HPV Vaccine  Aged Out   Meningitis B Vaccine  Aged Out  *Topic was postponed. The date shown is not the original due date.   No results found for: PSA1, PSA  Hypertension Pertinent negatives include no chest pain, headaches, palpitations or shortness of breath.  Diabetes Pertinent negatives for hypoglycemia include no dizziness, headaches or nervousness/anxiousness. Pertinent negatives for diabetes include no chest pain and no fatigue.  Hyperlipidemia Pertinent negatives include no chest pain, myalgias or shortness of breath.    Review of Systems  Constitutional:  Negative for appetite change, chills, diaphoresis, fatigue and unexpected weight change.  HENT:  Negative for hearing loss, tinnitus, trouble swallowing and voice change.   Eyes:  Negative for visual disturbance.  Respiratory:  Negative for choking, shortness of breath and wheezing.   Cardiovascular:  Negative for chest  pain, palpitations and leg swelling.  Gastrointestinal:  Negative for abdominal pain, blood in stool, constipation and diarrhea.  Genitourinary:  Negative for difficulty urinating, dysuria and frequency.  Musculoskeletal:  Negative for arthralgias, back pain and myalgias.  Skin:  Negative for color change and rash.  Neurological:  Negative for dizziness, syncope and headaches.  Hematological:  Negative for adenopathy.  Psychiatric/Behavioral:  Negative for dysphoric mood and sleep disturbance. The patient is not nervous/anxious.      Lab Results  Component Value Date   NA 133 (L) 07/16/2023   K 4.5 07/16/2023   CO2 24 07/16/2023   GLUCOSE 162 (H) 07/16/2023   BUN 24 (H) 07/16/2023   CREATININE 1.15 07/16/2023   CALCIUM  9.5 07/16/2023   EGFR 89 08/25/2021   GFRNONAA >60 07/16/2023   Lab Results  Component Value Date   CHOL 102 03/09/2023   HDL 33 (L) 03/09/2023   LDLCALC 18 03/09/2023   TRIG 253 (H) 03/09/2023   CHOLHDL 3.1 03/09/2023   No results found for: TSH Lab Results  Component Value Date   HGBA1C 7.7 (A) 11/15/2023   Lab Results  Component Value Date   WBC 7.8 03/09/2023   HGB 17.0 03/09/2023   HCT 47.7 03/09/2023   MCV 86.4 03/09/2023   PLT 216 03/09/2023   Lab Results  Component Value Date   ALT 14 07/16/2023   AST 14 (L) 07/16/2023   ALKPHOS 117 07/16/2023   BILITOT  0.8 07/16/2023   No results found for: MARIEN BOLLS, VD25OH   Patient Active Problem List   Diagnosis Date Noted   Encounter for screening colonoscopy    Pain of right hip joint 08/25/2017   Hematuria, microscopic 08/25/2017   Cervical stenosis of spinal canal 12/10/2015   Neural foraminal stenosis of cervical spine 12/10/2015   Cervical radiculopathy 11/22/2015   Type II diabetes mellitus with complication (HCC) 11/22/2014   Essential (primary) hypertension 11/22/2014   Glaucoma 11/22/2014   Bilateral hearing loss 11/22/2014   Hyperlipidemia associated with type 2  diabetes mellitus (HCC) 11/22/2014    Allergies  Allergen Reactions   Trulicity  [Dulaglutide ] Diarrhea    Past Surgical History:  Procedure Laterality Date   COLONOSCOPY WITH PROPOFOL  N/A 07/04/2018   Procedure: COLONOSCOPY WITH PROPOFOL ;  Surgeon: Unk Corinn Skiff, MD;  Location: [facility] ENDOSCOPY;  Service: Gastroenterology;  Laterality: N/A;   HIP SURGERY Right    as a baby for leg length discrepancy    Social History   Tobacco Use   Smoking status: Every Day    Current packs/day: 0.50    Average packs/day: 0.5 packs/day for 36.6 years (18.3 ttl pk-yrs)    Types: Cigars, Cigarettes    Start date: 08/12/1987   Smokeless tobacco: Never   Tobacco comments:    3-5 cigars a week since stopping cigarettes  Vaping Use   Vaping status: Never Used  Substance Use Topics   Alcohol use: No    Alcohol/week: 0.0 standard drinks of alcohol   Drug use: No     Medication list has been reviewed and updated.  Current Meds  Medication Sig   Accu-Chek FastClix Lancets MISC USE AS DIRECTED 4 TIMES A DAY   baclofen  (LIORESAL ) 10 MG tablet TAKE 1 TABLET BY MOUTH THREE TIMES A DAY   blood glucose meter kit and supplies KIT Dispense based on patient and insurance preference. Use up to four times daily as directed. (FOR ICD-9 250.00, 250.01).   empagliflozin  (JARDIANCE ) 25 MG TABS tablet Take 1 tablet (25 mg total) by mouth daily before breakfast.   glipiZIDE  (GLUCOTROL ) 5 MG tablet Take 1 tablet (5 mg total) by mouth 2 (two) times daily before a meal.   lisinopril  (ZESTRIL ) 20 MG tablet Take 1 tablet (20 mg total) by mouth daily.   meloxicam  (MOBIC ) 15 MG tablet TAKE 1 TABLET BY MOUTH EVERY DAY AS NEEDED FOR PAIN   metFORMIN  (GLUCOPHAGE ) 1000 MG tablet Take 1 tablet (1,000 mg total) by mouth 2 (two) times daily.   Multiple Vitamins-Minerals (MULTIVITAMIN WITH MINERALS) tablet Take 1 tablet by mouth daily.   nystatin -triamcinolone  ointment (MYCOLOG) APPLY 1 APPLICATION TOPICALLY 2 (TWO) TIMES  DAILY. TO GROIN RASH   pioglitazone  (ACTOS ) 15 MG tablet Take 1 tablet (15 mg total) by mouth daily.   rosuvastatin  (CRESTOR ) 5 MG tablet Take 1 tablet (5 mg total) by mouth daily.   [DISCONTINUED] sildenafil  (VIAGRA ) 100 MG tablet Take 0.5-1 tablets (50-100 mg total) by mouth daily as needed for erectile dysfunction.       03/21/2024    9:53 AM 11/15/2023    8:59 AM 03/29/2023    8:53 AM 03/09/2023    2:33 PM  GAD 7 : Generalized Anxiety Score  Nervous, Anxious, on Edge 0 0 0 0  Control/stop worrying 0 0 0 0  Worry too much - different things 0 0 0 0  Trouble relaxing 0 0 0 0  Restless 0 0 0 0  Easily annoyed or irritable  0 0 0 0  Afraid - awful might happen 0 0 0 0  Total GAD 7 Score 0 0 0 0  Anxiety Difficulty Not difficult at all Not difficult at all Not difficult at all Not difficult at all       03/21/2024    9:53 AM 11/15/2023    8:59 AM 03/29/2023    8:53 AM  Depression screen PHQ 2/9  Decreased Interest 0 0 0  Down, Depressed, Hopeless 0 0 0  PHQ - 2 Score 0 0 0  Altered sleeping 0 0 0  Tired, decreased energy 0 0 0  Change in appetite 0 0 0  Feeling bad or failure about yourself  0 0 0  Trouble concentrating 0 0 0  Moving slowly or fidgety/restless 0 0 0  Suicidal thoughts 0 0 0  PHQ-9 Score 0 0 0  Difficult doing work/chores Not difficult at all Not difficult at all Not difficult at all    BP Readings from Last 3 Encounters:  03/21/24 128/78  11/15/23 118/74  07/16/23 102/68    Physical Exam Vitals and nursing note reviewed.  Constitutional:      Appearance: Normal appearance. He is well-developed.  HENT:     Head: Normocephalic.     Right Ear: Tympanic membrane, ear canal and external ear normal.     Left Ear: Tympanic membrane, ear canal and external ear normal.     Nose: Nose normal.  Eyes:     Conjunctiva/sclera: Conjunctivae normal.     Pupils: Pupils are equal, round, and reactive to light.  Neck:     Thyroid: No thyromegaly.     Vascular:  No carotid bruit.  Cardiovascular:     Rate and Rhythm: Normal rate and regular rhythm.     Heart sounds: Normal heart sounds.  Pulmonary:     Effort: Pulmonary effort is normal.     Breath sounds: Normal breath sounds. No wheezing.  Chest:  Breasts:    Right: No mass.     Left: No mass.  Abdominal:     General: Bowel sounds are normal.     Palpations: Abdomen is soft.     Tenderness: There is no abdominal tenderness.  Musculoskeletal:        General: Normal range of motion.     Cervical back: Normal range of motion and neck supple.  Lymphadenopathy:     Cervical: No cervical adenopathy.  Skin:    General: Skin is warm and dry.  Neurological:     Mental Status: He is alert and oriented to person, place, and time.     Deep Tendon Reflexes: Reflexes are normal and symmetric.  Psychiatric:        Attention and Perception: Attention normal.        Mood and Affect: Mood normal.        Thought Content: Thought content normal.    Diabetic Foot Exam - Simple   Simple Foot Form Diabetic Foot exam was performed with the following findings: Yes 03/21/2024 10:26 AM  Visual Inspection No deformities, no ulcerations, no other skin breakdown bilaterally: Yes Sensation Testing Intact to touch and monofilament testing bilaterally: Yes Pulse Check Posterior Tibialis and Dorsalis pulse intact bilaterally: Yes Comments      Wt Readings from Last 3 Encounters:  03/21/24 134 lb (60.8 kg)  11/15/23 128 lb 9.6 oz (58.3 kg)  07/16/23 130 lb (59 kg)    BP 128/78   Pulse 78   Ht 5'  3 (1.6 m)   Wt 134 lb (60.8 kg)   SpO2 96%   BMI 23.74 kg/m   Assessment and Plan:  Problem List Items Addressed This Visit       Unprioritized   Type II diabetes mellitus with complication (HCC) (Chronic)   Currently medications are Jardiance , Actos , MTF and glipizide .  No hypoglycemic episodes noted. Last visit medical regimen changes were none. Lab Results  Component Value Date   HGBA1C 7.7  (A) 11/15/2023          Relevant Orders   Microalbumin / creatinine urine ratio   Comprehensive metabolic panel with GFR   Hemoglobin A1c   Essential (primary) hypertension (Chronic)   Well controlled blood pressure today. Current regimen is lisinopril . No medication side effects noted.        Relevant Medications   sildenafil  (VIAGRA ) 100 MG tablet   Other Relevant Orders   CBC with Differential/Platelet   Hyperlipidemia associated with type 2 diabetes mellitus (HCC) (Chronic)   LDL is  Lab Results  Component Value Date   LDLCALC 18 03/09/2023    Current medication regimen is Crestor . Goal LDL is < 70.       Relevant Medications   sildenafil  (VIAGRA ) 100 MG tablet   Other Relevant Orders   Lipid panel   Other Visit Diagnoses       Annual physical exam    -  Primary     Prostate cancer screening       Relevant Orders   PSA     Vasculogenic erectile dysfunction, unspecified vasculogenic erectile dysfunction type       Relevant Medications   sildenafil  (VIAGRA ) 100 MG tablet     Colon cancer screening       he is overdue for repeat 5 yr colonoscopy but he can not afford at this time will discuss next visit and refer if he has insurance     Long term current use of oral hypoglycemic drug           No follow-ups on file.    Leita HILARIO Adie, MD [facility] [place]

## 2024-03-21 NOTE — Assessment & Plan Note (Addendum)
 Currently medications are Jardiance , Actos , MTF and glipizide .  No hypoglycemic episodes noted. Last visit medical regimen changes were none. Lab Results  Component Value Date   HGBA1C 7.7 (A) 11/15/2023

## 2024-03-21 NOTE — Assessment & Plan Note (Signed)
 Well controlled blood pressure today. Current regimen is lisinopril . No medication side effects noted.

## 2024-05-28 ENCOUNTER — Other Ambulatory Visit: Payer: Self-pay | Admitting: Internal Medicine

## 2024-05-28 DIAGNOSIS — E118 Type 2 diabetes mellitus with unspecified complications: Secondary | ICD-10-CM

## 2024-05-28 DIAGNOSIS — I10 Essential (primary) hypertension: Secondary | ICD-10-CM

## 2024-05-30 NOTE — Telephone Encounter (Signed)
 Requested Prescriptions  Pending Prescriptions Disp Refills   pioglitazone  (ACTOS ) 15 MG tablet [Pharmacy Med Name: PIOGLITAZONE  HCL 15 MG TABLET] 90 tablet 1    Sig: TAKE 1 TABLET (15 MG TOTAL) BY MOUTH DAILY.     Endocrinology:  Diabetes - Glitazones - pioglitazone  Failed - 05/30/2024 12:05 PM      Failed - HBA1C is between 0 and 7.9 and within 180 days    Hemoglobin A1C  Date Value Ref Range Status  11/15/2023 7.7 (A) 4.0 - 5.6 % Final   Hgb A1c MFr Bld  Date Value Ref Range Status  07/16/2023 8.0 (H) 4.8 - 5.6 % Final    Comment:    (NOTE) Pre diabetes:          5.7%-6.4%  Diabetes:              >6.4%  Glycemic control for   <7.0% adults with diabetes          Passed - Valid encounter within last 6 months    Recent Outpatient Visits           2 months ago Annual physical exam   [place] [facility] at [facility], Leita DEL, MD   6 months ago Essential (primary) hypertension   [place] [facility] at [facility], Leita DEL, MD   10 months ago Type II diabetes mellitus with complication [facility])   [place] [facility] at [facility], Leita DEL, MD               metFORMIN  (GLUCOPHAGE ) 1000 MG tablet [Pharmacy Med Name: METFORMIN  HCL 1,000 MG TABLET] 180 tablet 1    Sig: TAKE 1 TABLET BY MOUTH TWICE A DAY     Endocrinology:  Diabetes - Biguanides Failed - 05/30/2024 12:05 PM      Failed - HBA1C is between 0 and 7.9 and within 180 days    Hemoglobin A1C  Date Value Ref Range Status  11/15/2023 7.7 (A) 4.0 - 5.6 % Final   Hgb A1c MFr Bld  Date Value Ref Range Status  07/16/2023 8.0 (H) 4.8 - 5.6 % Final    Comment:    (NOTE) Pre diabetes:          5.7%-6.4%  Diabetes:              >6.4%  Glycemic control for   <7.0% adults with diabetes          Failed - B12 Level in normal range and within 720 days    No results found for: VITAMINB12       Failed -  CBC within normal limits and completed in the last 12 months    WBC  Date Value Ref Range Status  03/09/2023 7.8 4.0 - 10.5 K/uL Final   RBC  Date Value Ref Range Status  03/09/2023 5.52 4.22 - 5.81 MIL/uL Final   Hemoglobin  Date Value Ref Range Status  03/09/2023 17.0 13.0 - 17.0 g/dL Final   HCT  Date Value Ref Range Status  03/09/2023 47.7 39.0 - 52.0 % Final   MCHC  Date Value Ref Range Status  03/09/2023 35.6 30.0 - 36.0 g/dL Final   [facility]  Date Value Ref Range Status  03/09/2023 30.8 26.0 - 34.0 pg Final   MCV  Date Value Ref Range Status  03/09/2023 86.4 80.0 - 100.0 fL Final   No results found for: PLTCOUNTKUC,  LABPLAT, POCPLA RDW  Date Value Ref Range Status  03/09/2023 13.4 11.5 - 15.5 % Final         Passed - Cr in normal range and within 360 days    Creatinine, Ser  Date Value Ref Range Status  07/16/2023 1.15 0.61 - 1.24 mg/dL Final         Passed - eGFR in normal range and within 360 days    GFR calc Af Amer  Date Value Ref Range Status  06/09/2018 >60 >60 mL/min Final   GFR, Estimated  Date Value Ref Range Status  07/16/2023 >60 >60 mL/min Final    Comment:    (NOTE) Calculated using the CKD-EPI Creatinine Equation (2021)    eGFR  Date Value Ref Range Status  08/25/2021 89 >59 mL/min/1.73 Final         Passed - Valid encounter within last 6 months    Recent Outpatient Visits           2 months ago Annual physical exam   [place] [facility] at [facility], Leita DEL, MD   6 months ago Essential (primary) hypertension   [place] [facility] at [facility], Leita DEL, MD   10 months ago Type II diabetes mellitus with complication [facility])   [place] [facility] at [facility], Leita DEL, MD               lisinopril  (ZESTRIL ) 20 MG tablet [Pharmacy Med Name: LISINOPRIL  20 MG TABLET] 90 tablet 1    Sig: TAKE 1 TABLET BY MOUTH  EVERY DAY     Cardiovascular:  ACE Inhibitors Failed - 05/30/2024 12:05 PM      Failed - Cr in normal range and within 180 days    Creatinine, Ser  Date Value Ref Range Status  07/16/2023 1.15 0.61 - 1.24 mg/dL Final         Failed - K in normal range and within 180 days    Potassium  Date Value Ref Range Status  07/16/2023 4.5 3.5 - 5.1 mmol/L Final         Passed - Patient is not pregnant      Passed - Last BP in normal range    BP Readings from Last 1 Encounters:  03/21/24 128/78         Passed - Valid encounter within last 6 months    Recent Outpatient Visits           2 months ago Annual physical exam   [place] [facility] at [facility], Leita DEL, MD   6 months ago Essential (primary) hypertension   [place] [facility] at [facility], Leita DEL, MD   10 months ago Type II diabetes mellitus with complication [facility])   [place] [facility] at [facility], Leita DEL, MD

## 2024-06-16 ENCOUNTER — Telehealth: Payer: Self-pay

## 2024-06-16 NOTE — Telephone Encounter (Signed)
 Called patient in regards to medications. Separate telephone encounter made.  JM

## 2024-06-16 NOTE — Telephone Encounter (Signed)
 Copied from CRM #[id number]. Topic: Clinical - Medication Question >> Jun 16, 2024 10:27 AM Larissa RAMAN wrote: Reason for CRM: Patient states he his out of his blood pressure medication and needs a refill, but he is not sure of the name. Reviewed medication list with patient, but he is still unsure which medication he needs as he has thrown bottle away. Patient requesting a callback.

## 2024-06-16 NOTE — Telephone Encounter (Signed)
 Spoke with patient and he was under the impression he was missing a pill that he is suppose to take twice daily. He thought he was suppose to have 3 different medications that he takes 2 x daily but in fact he only has 2 medications on file that are supposed to be taking BID. He is at ease now that he know he is not missing any medications. I also advised him to bring all medications in to the office at his appt in April. He will bring all medications in RX bottle to appt.   JM

## 2024-07-03 ENCOUNTER — Other Ambulatory Visit: Payer: Self-pay | Admitting: Internal Medicine

## 2024-07-03 DIAGNOSIS — E118 Type 2 diabetes mellitus with unspecified complications: Secondary | ICD-10-CM

## 2024-07-04 NOTE — Telephone Encounter (Signed)
 Requested medications are due for refill today.  yes  Requested medications are on the active medications list.  yes  Last refill. 11/15/2023 #90 1 rf  Future visit scheduled.   yes  Notes to clinic.  Labs are expired.     Requested Prescriptions  Pending Prescriptions Disp Refills   empagliflozin  (JARDIANCE ) 25 MG TABS tablet 90 tablet 1    Sig: Take 1 tablet (25 mg total) by mouth daily before breakfast.     Endocrinology:  Diabetes - SGLT2 Inhibitors Failed - 07/04/2024  3:39 PM      Failed - HBA1C is between 0 and 7.9 and within 180 days    Hemoglobin A1C  Date Value Ref Range Status  11/15/2023 7.7 (A) 4.0 - 5.6 % Final   Hgb A1c MFr Bld  Date Value Ref Range Status  07/16/2023 8.0 (H) 4.8 - 5.6 % Final    Comment:    (NOTE) Pre diabetes:          5.7%-6.4%  Diabetes:              >6.4%  Glycemic control for   <7.0% adults with diabetes          Passed - Cr in normal range and within 360 days    Creatinine, Ser  Date Value Ref Range Status  07/16/2023 1.15 0.61 - 1.24 mg/dL Final         Passed - eGFR in normal range and within 360 days    GFR calc Af Amer  Date Value Ref Range Status  06/09/2018 >60 >60 mL/min Final   GFR, Estimated  Date Value Ref Range Status  07/16/2023 >60 >60 mL/min Final    Comment:    (NOTE) Calculated using the CKD-EPI Creatinine Equation (2021)    eGFR  Date Value Ref Range Status  08/25/2021 89 >59 mL/min/1.73 Final         Passed - Valid encounter within last 6 months    Recent Outpatient Visits           3 months ago Annual physical exam   [place] [facility] at [facility], Leita DEL, MD   7 months ago Essential (primary) hypertension   [place] [facility] at [facility], Leita DEL, MD   11 months ago Type II diabetes mellitus with complication [facility])   [place] [facility] at [facility], Leita DEL, MD

## 2024-07-05 ENCOUNTER — Other Ambulatory Visit: Payer: Self-pay

## 2024-07-05 DIAGNOSIS — E118 Type 2 diabetes mellitus with unspecified complications: Secondary | ICD-10-CM

## 2024-07-05 MED ORDER — EMPAGLIFLOZIN 25 MG PO TABS: 25.0000 mg | ORAL_TABLET | Freq: Every day | ORAL | 1 refills | Status: DC

## 2024-07-05 MED ORDER — EMPAGLIFLOZIN 25 MG PO TABS: 25.0000 mg | ORAL_TABLET | Freq: Every day | ORAL | 1 refills | Status: AC

## 2024-07-05 NOTE — Telephone Encounter (Signed)
 Patient medication was pended and reordered.

## 2024-07-05 NOTE — Telephone Encounter (Signed)
 Please review.. Thank you

## 2024-07-06 ENCOUNTER — Telehealth: Payer: Self-pay | Admitting: Family Medicine

## 2024-07-06 ENCOUNTER — Other Ambulatory Visit (HOSPITAL_COMMUNITY): Payer: Self-pay

## 2024-07-06 NOTE — Telephone Encounter (Signed)
 Review and send alternative  JM

## 2024-07-06 NOTE — Telephone Encounter (Signed)
 Hi Jess, Could you please follow up.

## 2024-07-06 NOTE — Telephone Encounter (Signed)
 Never saw him, Was there a PA done?

## 2024-07-06 NOTE — Telephone Encounter (Signed)
 No there is no record of us  doing a PA for this patient. If PA is needed we will need his prescription coverage information

## 2024-07-06 NOTE — Telephone Encounter (Unsigned)
 Copied from CRM [id number]. Topic: Clinical - Medication Question >> Jul 06, 2024 11:07 AM Gustabo D wrote: empagliflozin  (JARDIANCE ) 25 MG TABS tablet- pt is needing a cheaper alternative says this medication is $900

## 2024-07-07 NOTE — Telephone Encounter (Signed)
 Called patient and let him know meds are at front desk for pick up. He was very thankful.  JM

## 2024-07-07 NOTE — Telephone Encounter (Signed)
 Thanks

## 2024-07-07 NOTE — Telephone Encounter (Signed)
 We can do 4 weeks worth sample.

## 2024-07-07 NOTE — Telephone Encounter (Signed)
 Hi Dr. MARLA - I spoke with patient he does not have insurance is there a way we can get him a different medication or get him help to pay for his medication? He does have a appt with you coming up in March. Thank you  Lieutenant Abarca

## 2024-08-04 ENCOUNTER — Encounter: Payer: Self-pay | Admitting: Family Medicine

## 2024-09-19 ENCOUNTER — Encounter: Payer: Self-pay | Admitting: Family Medicine
